# Patient Record
Sex: Male | Born: 1990 | Race: Black or African American | Hispanic: No | Marital: Single | Smoking: Former smoker
Health system: Southern US, Community
[De-identification: ages and names within clinical notes are randomized; demographics above are authoritative.]

## PROBLEM LIST (undated history)

## (undated) DIAGNOSIS — J40 Bronchitis, not specified as acute or chronic: Secondary | ICD-10-CM

## (undated) HISTORY — PX: OTHER SURGICAL HISTORY: SHX169

---

## 2010-06-25 ENCOUNTER — Emergency Department (HOSPITAL_COMMUNITY): Admission: EM | Admit: 2010-06-25 | Discharge: 2010-06-25 | Payer: Self-pay | Admitting: Family Medicine

## 2011-10-28 ENCOUNTER — Emergency Department (HOSPITAL_COMMUNITY)
Admission: EM | Admit: 2011-10-28 | Discharge: 2011-10-29 | Disposition: A | Discharge status: Home / Self Care | Payer: BC Managed Care – PPO | Attending: Emergency Medicine | Admitting: Emergency Medicine

## 2011-10-28 ENCOUNTER — Encounter (HOSPITAL_COMMUNITY): Payer: Self-pay | Admitting: *Deleted

## 2011-10-28 DIAGNOSIS — S8000XA Contusion of unspecified knee, initial encounter: Secondary | ICD-10-CM

## 2011-10-28 DIAGNOSIS — S025XXA Fracture of tooth (traumatic), initial encounter for closed fracture: Secondary | ICD-10-CM

## 2011-10-28 DIAGNOSIS — S60229A Contusion of unspecified hand, initial encounter: Secondary | ICD-10-CM | POA: Insufficient documentation

## 2011-10-28 DIAGNOSIS — F17200 Nicotine dependence, unspecified, uncomplicated: Secondary | ICD-10-CM | POA: Insufficient documentation

## 2011-10-28 DIAGNOSIS — W1789XA Other fall from one level to another, initial encounter: Secondary | ICD-10-CM | POA: Insufficient documentation

## 2011-10-28 NOTE — ED Notes (Signed)
The pt says he had a long fall approx one hour ago.  Rt knee face pain with scattered abrasion,  Rt  And lt central incisors upper broken.

## 2011-10-29 ENCOUNTER — Emergency Department (HOSPITAL_COMMUNITY): Payer: BC Managed Care – PPO

## 2011-10-29 ENCOUNTER — Encounter (HOSPITAL_COMMUNITY): Payer: Self-pay | Admitting: Radiology

## 2011-10-29 MED ORDER — PENICILLIN V POTASSIUM 500 MG PO TABS: 500.0000 mg | ORAL_TABLET | Freq: Four times a day (QID) | ORAL | Status: AC

## 2011-10-29 MED ORDER — SODIUM CHLORIDE 0.9 % IV BOLUS (SEPSIS)
1000.0000 mL | Freq: Once | INTRAVENOUS | Status: AC
  Administered 2011-10-29: 1000 mL via INTRAVENOUS

## 2011-10-29 MED ORDER — HYDROMORPHONE HCL PF 1 MG/ML IJ SOLN
1.0000 mg | Freq: Once | INTRAMUSCULAR | Status: AC
  Administered 2011-10-29: 1 mg via INTRAVENOUS
  Filled 2011-10-29: qty 1

## 2011-10-29 MED ORDER — ONDANSETRON HCL 4 MG/2ML IJ SOLN
4.0000 mg | Freq: Once | INTRAMUSCULAR | Status: AC
  Administered 2011-10-29: 4 mg via INTRAVENOUS
  Filled 2011-10-29: qty 2

## 2011-10-29 MED ORDER — HYDROCODONE-ACETAMINOPHEN 5-500 MG PO TABS: 1.0000 | ORAL_TABLET | Freq: Four times a day (QID) | ORAL | Status: AC | PRN

## 2011-10-29 MED ORDER — NAPROXEN 500 MG PO TABS: 500.0000 mg | ORAL_TABLET | Freq: Two times a day (BID) | ORAL | Status: DC

## 2011-10-29 NOTE — ED Provider Notes (Signed)
History     CSN: [id number]  Arrival date & time 10/28/11  2307   First MD Initiated Contact with Patient 10/29/11 0231      Chief Complaint  Patient presents with  . Fall    (Consider location/radiation/quality/duration/timing/severity/associated sxs/prior treatment) Patient is a 21 y.o. male presenting with fall. The history is provided by the patient. No language interpreter was used.  Fall The accident occurred 3 to 5 hours ago. Incident: while running and jumping from fence. He fell from a height of 3 to 5 ft. He landed on grass. The volume of blood lost was minimal. The point of impact was the head and right knee. The pain is present in the head and right knee (hand). The pain is moderate. He was ambulatory at the scene. Associated symptoms include headaches. Pertinent negatives include no visual change, no fever, no numbness, no abdominal pain, no nausea, no vomiting and no loss of consciousness.    History reviewed. No pertinent past medical history.  History reviewed. No pertinent past surgical history.  History reviewed. No pertinent family history.  History  Substance Use Topics  . Smoking status: Current Everyday Smoker  . Smokeless tobacco: Not on file  . Alcohol Use: No      Review of Systems  Constitutional: Negative for fever, activity change, appetite change and fatigue.  HENT: Negative for congestion, sore throat, rhinorrhea, neck pain and neck stiffness.   Respiratory: Negative for cough and shortness of breath.   Cardiovascular: Negative for chest pain and palpitations.  Gastrointestinal: Negative for nausea, vomiting and abdominal pain.  Genitourinary: Negative for dysuria, urgency, frequency and flank pain.  Musculoskeletal: Positive for arthralgias.  Neurological: Positive for headaches. Negative for dizziness, loss of consciousness, weakness, light-headedness and numbness.  All other systems reviewed and are negative.    Allergies  Review of  patient's allergies indicates no known allergies.  Home Medications   Current Outpatient Rx  Name Route Sig Dispense Refill  . HYDROCODONE-ACETAMINOPHEN 5-500 MG PO TABS Oral Take 1-2 tablets by mouth every 6 (six) hours as needed for pain. 15 tablet 0  . NAPROXEN 500 MG PO TABS Oral Take 1 tablet (500 mg total) by mouth 2 (two) times daily. 30 tablet 0  . PENICILLIN V POTASSIUM 500 MG PO TABS Oral Take 1 tablet (500 mg total) by mouth 4 (four) times daily. 40 tablet 0    BP 108/63  Pulse 95  Temp(Src) 98.5 F (36.9 C) (Oral)  Resp 18  SpO2 98%  Physical Exam  Nursing note and vitals reviewed. Constitutional: He is oriented to person, place, and time. He appears well-developed and well-nourished.       Uncomfortable in appearance  HENT:  Head: Normocephalic and atraumatic.  Mouth/Throat: Oropharynx is clear and moist.         Ellis 3 and ellis 2 fractures to the upper front teeth.  Bleeding controlled  Eyes: Conjunctivae and EOM are normal. Pupils are equal, round, and reactive to light.  Neck: Normal range of motion. Neck supple.  Cardiovascular: Normal rate, regular rhythm, normal heart sounds and intact distal pulses.  Exam reveals no gallop and no friction rub.   No murmur heard. Pulmonary/Chest: Effort normal and breath sounds normal. No respiratory distress.  Abdominal: Soft. Bowel sounds are normal. There is no tenderness.  Musculoskeletal: He exhibits tenderness.       Pain on palpation to the L hand and the R knee with full rom.  No obvious deformity  Neurological: He is alert and oriented to person, place, and time. No cranial nerve deficit.  Skin: Skin is warm and dry.       Scattered abrasions    ED Course  Procedures (including critical care time)  Labs Reviewed - No data to display Dg Knee Complete 4 Views Right  10/29/2011  *RADIOLOGY REPORT*  Clinical Data: Status post fall.  Right knee pain.  RIGHT KNEE - COMPLETE 4+ VIEW  Comparison: None.  Findings:  There is no evidence of fracture or dislocation.  The joint spaces are preserved.  No significant degenerative change is seen; the patellofemoral joint is grossly unremarkable in appearance.  Minimal irregularity at the upper pole of the patella likely remains within normal limits.  No significant joint effusion is seen.  The visualized soft tissues are normal in appearance.  IMPRESSION: No evidence of fracture or dislocation.  Original Report Authenticated By: Tonia Ghent, M.D.   Dg Hand Complete Left  10/29/2011  *RADIOLOGY REPORT*  Clinical Data: Status post fall; left hand pain.  LEFT HAND - COMPLETE 3+ VIEW  Comparison: None.  Findings: There is no evidence of fracture or dislocation.  A tiny osseous fragment adjacent to the base of the fifth metacarpal likely reflects minimal degenerative change.  The joint spaces are preserved; the soft tissues are unremarkable in appearance.  The carpal rows are intact, and demonstrate normal alignment.  IMPRESSION: No definite evidence of fracture or dislocation; tiny osseous fragment adjacent to the base of the fifth metacarpal likely reflects minimal degenerative change.  Original Report Authenticated By: Tonia Ghent, M.D.   Ct Maxillofacial Wo Cm  10/29/2011  *RADIOLOGY REPORT*  Clinical Data: Hit face and mouth on concrete after jumping over fence; broke front teeth.  Concern for facial injury.  CT MAXILLOFACIAL WITHOUT CONTRAST  Technique:  Multidetector CT imaging of the maxillofacial structures was performed. Multiplanar CT image reconstructions were also generated.  Comparison: None.  Findings: Tiny osseous fragments adjacent to the right central maxillary incisor may reflect the known front tooth fractures.  There is no additional evidence of fracture or dislocation.  The maxilla and mandible appear intact.  The nasal bone is unremarkable in appearance.  The orbits are intact bilaterally.  The paranasal sinuses and mastoid air cells are well-aerated.   Mild soft tissue swelling is noted overlying the maxilla bilaterally.  The soft tissues are otherwise unremarkable in appearance.  The parapharyngeal fat planes are preserved.  The nasopharynx, oropharynx and hypopharynx are unremarkable in appearance.  The visualized portions of the valleculae and piriform sinuses are grossly unremarkable.  The parotid and submandibular glands are within normal limits.  No cervical lymphadenopathy is seen.  The visualized portions of the brain are unremarkable.  IMPRESSION:  1.  Tiny osseous fragment adjacent to the right central maxillary incisor may reflect the known front tooth fractures. 2.  Mild soft tissue swelling noted overlying the maxilla bilaterally. 3.  Otherwise unremarkable maxillofacial CT.  Original Report Authenticated By: Tonia Ghent, M.D.     1. Traumatic fracture of tooth   2. Hand contusion   3. Knee contusion       MDM  Patient was given followup for dentistry. Protective coating was placed over his fractured teeth. He is placed in a splint due to the possibility of a avulsion type fracture. Instructed to followup with orthopedics. Should followup with dentistry today. Provided penicillin, pain medication        Dayton Bailiff, MD 10/29/11 [id number]

## 2011-10-29 NOTE — ED Notes (Signed)
Ortho paged for splint 

## 2011-10-29 NOTE — ED Provider Notes (Signed)
  Physical Exam  BP 108/63  Pulse 95  Temp(Src) 98.5 F (36.9 C) (Oral)  Resp 18  SpO2 98%  Physical Exam  ED Course  Dental Date/Time: 10/29/2011 3:10 AM Performed by: Arman Filter Authorized by: Arman Filter Consent: Verbal consent obtained. Consent given by: patient Patient identity confirmed: verbally with patient Local anesthesia used: no Patient sedated: no Comments: Sealing cement applied to upper frontal incisors    MDM Tony Carr 2 fracture of the upper central incisors      Arman Filter, NP 10/29/11 [phone]

## 2011-10-29 NOTE — Discharge Instructions (Signed)
Contusion A contusion is a deep bruise. Contusions are the result of an injury that caused bleeding under the skin. The contusion may turn blue, purple, or yellow. Minor injuries will give you a painless contusion, but more severe contusions may stay painful and swollen for a few weeks.  CAUSES  A contusion is usually caused by a blow, trauma, or direct force to an area of the body. SYMPTOMS   Swelling and redness of the injured area.   Bruising of the injured area.   Tenderness and soreness of the injured area.   Pain.  DIAGNOSIS  The diagnosis can be made by taking a history and physical exam. An X-ray, CT scan, or MRI may be needed to determine if there were any associated injuries, such as fractures. TREATMENT  Specific treatment will depend on what area of the body was injured. In general, the best treatment for a contusion is resting, icing, elevating, and applying cold compresses to the injured area. Over-the-counter medicines may also be recommended for pain control. Ask your caregiver what the best treatment is for your contusion. HOME CARE INSTRUCTIONS   Put ice on the injured area.   Put ice in a plastic bag.   Place a towel between your skin and the bag.   Leave the ice on for 15 to 20 minutes, 3 to 4 times a day.   Only take over-the-counter or prescription medicines for pain, discomfort, or fever as directed by your caregiver. Your caregiver may recommend avoiding anti-inflammatory medicines (aspirin, ibuprofen, and naproxen) for 48 hours because these medicines may increase bruising.   Rest the injured area.   If possible, elevate the injured area to reduce swelling.  SEEK IMMEDIATE MEDICAL CARE IF:   You have increased bruising or swelling.   You have pain that is getting worse.   Your swelling or pain is not relieved with medicines.  MAKE SURE YOU:   Understand these instructions.   Will watch your condition.   Will get help right away if you are not  doing well or get worse.  Document Released: 05/07/2005 Document Revised: 07/17/2011 Document Reviewed: 06/02/2011 [facility] Patient Information 2012 [place], [place].  Dental Fracture You have a dental fracture or injury. This can mean the tooth is loose, has a chip in the enamel or is broken. If just the outer enamel is chipped, there is a good chance the tooth will not become infected. The only treatment needed may be to smooth off a rough edge. Fractures into the deeper layers (dentin and pulp) cause greater pain and are more likely to become infected. These require you to see a dentist as soon as possible to save the tooth. Loose teeth may need to be wired or bonded with a plastic splint to hold them in place. A paste may be painted on the open area of the broken tooth to reduce the pain. Antibiotics and pain medicine may be prescribed. Choosing a soft or liquid diet and rinsing the mouth out with warm water after meals may be helpful. See your dentist as recommended. Failure to seek care or follow up with a dentist or other specialist as recommended could result in the loss of your tooth, infection, or permanent dental problems. SEEK MEDICAL CARE IF:   You have increased pain not controlled with medicines.   You have swelling around the tooth, in the face or neck.   You have bleeding which starts, continues, or gets worse.   You have a fever.  Document Released: 09/04/2004 Document Revised: 07/17/2011 Document Reviewed: 06/19/2009 [facility] Patient Information 2012 [place], [place].  RESOURCE GUIDE  Dental Problems  Patients with Medicaid: [facility] [phone] [address]                                           [phone] [address] Phone:  [phone]                                                  Phone:  [phone]  If unable to pay or uninsured, contact:  [facility] or [facility]. to become qualified for the adult  dental clinic.  Chronic Pain Problems Contact [facility]  [phone] Patients need to be referred by their primary care doctor.  Insufficient Money for Medicine Contact [employer]:  call "211" or [facility] 289 469 8173.  No Primary Care Doctor Call [facility]  [phone] Other agencies that provide inexpensive medical care    [facility]  [phone]    [facility]  [phone]    [facility]  7800246703    [facility]  [phone]    [facility]  [phone]    [facility]  [phone]  Psychological Services [facility]  [phone] [facility]  [phone] [facility]   [phone] (emergency services [phone])  Substance Abuse Resources [facility]  6053002413 [facility] 3033684919 [facility] [phone] Floydene Flock [phone] Residential & Outpatient Substance Abuse Program  9362049290  Abuse/Neglect [facility] Child Abuse Hotline 561-064-8186 [facility] Child Abuse Hotline [phone] (After Hours)  Emergency Shelter [facility] (251)673-0078  M[facility] [place] [facility] 325-410-1726 [facility] [phone]  MRSA Hotline #:   938 800 6479    [facility] Resources  [facility]     [employer]                          [facility] Dept. [address] [place]                       [address]      [address]  [place]                                                Cristobal Goldmann Phone:  [phone]                                   Phone:  [phone]  Phone:  [phone]  [facility] Phone:  [phone]  [facility] Child Abuse Hotline ([phone])[phone] (After Hours)

## 2012-04-07 ENCOUNTER — Encounter (HOSPITAL_COMMUNITY): Payer: Self-pay | Admitting: Family Medicine

## 2012-04-07 ENCOUNTER — Emergency Department (HOSPITAL_COMMUNITY)
Admission: EM | Admit: 2012-04-07 | Discharge: 2012-04-07 | Disposition: A | Discharge status: Home / Self Care | Payer: BC Managed Care – PPO | Attending: Emergency Medicine | Admitting: Emergency Medicine

## 2012-04-07 ENCOUNTER — Emergency Department (HOSPITAL_COMMUNITY): Payer: BC Managed Care – PPO

## 2012-04-07 DIAGNOSIS — F17200 Nicotine dependence, unspecified, uncomplicated: Secondary | ICD-10-CM | POA: Insufficient documentation

## 2012-04-07 DIAGNOSIS — J069 Acute upper respiratory infection, unspecified: Secondary | ICD-10-CM

## 2012-04-07 DIAGNOSIS — B9789 Other viral agents as the cause of diseases classified elsewhere: Secondary | ICD-10-CM | POA: Insufficient documentation

## 2012-04-07 DIAGNOSIS — B349 Viral infection, unspecified: Secondary | ICD-10-CM

## 2012-04-07 MED ORDER — IBUPROFEN 800 MG PO TABS: 800.0000 mg | ORAL_TABLET | Freq: Three times a day (TID) | ORAL | Status: AC | PRN

## 2012-04-07 MED ORDER — HYDROCOD POLST-CHLORPHEN POLST 10-8 MG/5ML PO LQCR
5.0000 mL | Freq: Once | ORAL | Status: AC
  Administered 2012-04-07: 5 mL via ORAL
  Filled 2012-04-07: qty 5

## 2012-04-07 MED ORDER — HYDROCOD POLST-CHLORPHEN POLST 10-8 MG/5ML PO LQCR: 5.0000 mL | Freq: Two times a day (BID) | ORAL | Status: DC

## 2012-04-07 MED ORDER — IBUPROFEN 400 MG PO TABS
800.0000 mg | ORAL_TABLET | Freq: Once | ORAL | Status: AC
  Administered 2012-04-07: 800 mg via ORAL
  Filled 2012-04-07: qty 2

## 2012-04-07 NOTE — ED Provider Notes (Signed)
History     CSN: [id number]  Arrival date & time 04/07/12  1750   First MD Initiated Contact with Patient 04/07/12 1906      Chief Complaint  Patient presents with  . Cough  . Fever  . Chills   HPI  History provided by the patient. Patient is a 21 year old male with no significant PMH who presents with complaints of cough, fever, chills and body aches. Symptoms began to 3 days ago. Patient has been trying to rest and drink plenty of fluids but has not tried any medications for symptoms. He reports productive cough of yellow phlegm and sputum. Coughing also causes some pain in the chest. Symptoms are also associated with sore throat, nasal congestion and rhinorrhea. Patient has had generalized fatigue. Patient has not had any recent travel. He does not know of any known sick contacts. He denies any hemoptysis. Patient does report occasional episodes of nausea and vomiting after severe coughing. Patient denies any abdominal pain, diarrhea or constipation.    History reviewed. No pertinent past medical history.  History reviewed. No pertinent past surgical history.  History reviewed. No pertinent family history.  History  Substance Use Topics  . Smoking status: Current Everyday Smoker  . Smokeless tobacco: Not on file  . Alcohol Use: No      Review of Systems  Constitutional: Positive for fever, chills, diaphoresis, appetite change and fatigue. Negative for unexpected weight change.  HENT: Positive for congestion, sore throat and rhinorrhea. Negative for ear pain.   Respiratory: Positive for cough. Negative for shortness of breath.   Cardiovascular: Positive for chest pain.  Gastrointestinal: Positive for nausea and vomiting. Negative for abdominal pain, diarrhea and constipation.    Allergies  Review of patient's allergies indicates no known allergies.  Home Medications  No current outpatient prescriptions on file.  BP 118/79  Pulse 98  Temp 99.6 F (37.6 C)  Resp  18  SpO2 99%  Physical Exam  Nursing note and vitals reviewed. Constitutional: He is oriented to person, place, and time. He appears well-developed and well-nourished. No distress.  HENT:  Head: Normocephalic.  Right Ear: Tympanic membrane normal.  Left Ear: Tympanic membrane normal.  Mouth/Throat: Oropharynx is clear and moist.       Mild erythema of pharynx  Neck: Normal range of motion. Neck supple.       No meningeal sign  Cardiovascular: Normal rate and regular rhythm.   Pulmonary/Chest: Effort normal and breath sounds normal. No respiratory distress. He has no wheezes. He has no rales.  Abdominal: Soft. There is no tenderness. There is no rebound and no guarding.  Lymphadenopathy:    He has no cervical adenopathy.  Neurological: He is alert and oriented to person, place, and time.  Skin: Skin is warm.  Psychiatric: He has a normal mood and affect. His behavior is normal.    ED Course  Procedures   Dg Chest 2 View  04/07/2012  *RADIOLOGY REPORT*  Clinical Data: Cough, fever, chills.  CHEST - 2 VIEW  Comparison: None.  Findings: Lungs clear.  Heart size and pulmonary vascularity normal.  No effusion.  Visualized bones unremarkable.  IMPRESSION: No acute disease   Original Report Authenticated By: Thora Lance III, M.D.      1. URI (upper respiratory infection)   2. Viral infection       MDM  8:10 PM patient seen and evaluated. Patient is well-appearing and not toxic. X-ray unremarkable. Lungs are clear on auscultation. At this  time suspect viral process the        Angus Seller, [place] 04/07/12 2053

## 2012-04-07 NOTE — ED Provider Notes (Signed)
Medical screening examination/treatment/procedure(s) were performed by non-physician practitioner and as supervising physician I was immediately available for consultation/collaboration.   Tawona Filsinger, MD 04/07/12 2146 

## 2012-04-07 NOTE — ED Notes (Signed)
Per pt sts N,V, cold sweats. sts feels like he is coming down with the flu

## 2014-03-14 ENCOUNTER — Encounter (HOSPITAL_COMMUNITY): Payer: Self-pay | Admitting: Emergency Medicine

## 2014-03-14 ENCOUNTER — Emergency Department (HOSPITAL_COMMUNITY): Payer: BC Managed Care – PPO

## 2014-03-14 ENCOUNTER — Emergency Department (HOSPITAL_COMMUNITY)
Admission: EM | Admit: 2014-03-14 | Discharge: 2014-03-14 | Disposition: A | Discharge status: Home / Self Care | Payer: BC Managed Care – PPO | Attending: Emergency Medicine | Admitting: Emergency Medicine

## 2014-03-14 DIAGNOSIS — R51 Headache: Secondary | ICD-10-CM

## 2014-03-14 DIAGNOSIS — F17200 Nicotine dependence, unspecified, uncomplicated: Secondary | ICD-10-CM | POA: Insufficient documentation

## 2014-03-14 DIAGNOSIS — S0990XA Unspecified injury of head, initial encounter: Secondary | ICD-10-CM | POA: Insufficient documentation

## 2014-03-14 DIAGNOSIS — R519 Headache, unspecified: Secondary | ICD-10-CM

## 2014-03-14 DIAGNOSIS — R296 Repeated falls: Secondary | ICD-10-CM | POA: Insufficient documentation

## 2014-03-14 DIAGNOSIS — Y9389 Activity, other specified: Secondary | ICD-10-CM | POA: Insufficient documentation

## 2014-03-14 DIAGNOSIS — Y92009 Unspecified place in unspecified non-institutional (private) residence as the place of occurrence of the external cause: Secondary | ICD-10-CM | POA: Insufficient documentation

## 2014-03-14 DIAGNOSIS — R079 Chest pain, unspecified: Secondary | ICD-10-CM | POA: Insufficient documentation

## 2014-03-14 DIAGNOSIS — W19XXXA Unspecified fall, initial encounter: Secondary | ICD-10-CM

## 2014-03-14 LAB — CBC
HEMATOCRIT: 42.1 % (ref 39.0–52.0)
HEMOGLOBIN: 14.5 g/dL (ref 13.0–17.0)
MCH: 30.1 pg (ref 26.0–34.0)
MCHC: 34.4 g/dL (ref 30.0–36.0)
MCV: 87.5 fL (ref 78.0–100.0)
Platelets: 274 10*3/uL (ref 150–400)
RBC: 4.81 MIL/uL (ref 4.22–5.81)
RDW: 13.5 % (ref 11.5–15.5)
WBC: 4.4 10*3/uL (ref 4.0–10.5)

## 2014-03-14 LAB — BASIC METABOLIC PANEL
ANION GAP: 12 (ref 5–15)
BUN: 18 mg/dL (ref 6–23)
CALCIUM: 8.7 mg/dL (ref 8.4–10.5)
CO2: 24 meq/L (ref 19–32)
CREATININE: 1.25 mg/dL (ref 0.50–1.35)
Chloride: 105 mEq/L (ref 96–112)
GFR calc Af Amer: >90 mL/min (ref >90)
GFR calc non Af Amer: 79 mL/min — ABNORMAL LOW (ref >90)
Glucose, Bld: 126 mg/dL — ABNORMAL HIGH (ref 70–99)
Potassium: 3.5 mEq/L — ABNORMAL LOW (ref 3.7–5.3)
Sodium: 141 mEq/L (ref 137–147)

## 2014-03-14 LAB — I-STAT TROPONIN, ED: Troponin i, poc: 0 ng/mL (ref 0.00–0.08)

## 2014-03-14 LAB — CBG MONITORING, ED: Glucose-Capillary: 157 mg/dL — ABNORMAL HIGH (ref 70–99)

## 2014-03-14 MED ORDER — SODIUM CHLORIDE 0.9 % IV SOLN
1000.0000 mL | INTRAVENOUS | Status: DC
  Administered 2014-03-14: 1000 mL via INTRAVENOUS

## 2014-03-14 MED ORDER — DIPHENHYDRAMINE HCL 50 MG/ML IJ SOLN
25.0000 mg | Freq: Once | INTRAMUSCULAR | Status: AC
  Administered 2014-03-14: 25 mg via INTRAVENOUS
  Filled 2014-03-14: qty 1

## 2014-03-14 MED ORDER — METOCLOPRAMIDE HCL 5 MG/ML IJ SOLN
10.0000 mg | Freq: Once | INTRAMUSCULAR | Status: AC
  Administered 2014-03-14: 10 mg via INTRAVENOUS
  Filled 2014-03-14: qty 2

## 2014-03-14 MED ORDER — ONDANSETRON HCL 4 MG/2ML IJ SOLN
4.0000 mg | Freq: Once | INTRAMUSCULAR | Status: DC
  Filled 2014-03-14 (×2): qty 2

## 2014-03-14 MED ORDER — METOCLOPRAMIDE HCL 10 MG PO TABS: 10.0000 mg | ORAL_TABLET | Freq: Four times a day (QID) | ORAL | Status: DC | PRN

## 2014-03-14 MED ORDER — SODIUM CHLORIDE 0.9 % IV SOLN
1000.0000 mL | Freq: Once | INTRAVENOUS | Status: AC
  Administered 2014-03-14: 1000 mL via INTRAVENOUS

## 2014-03-14 MED ORDER — KETOROLAC TROMETHAMINE 30 MG/ML IJ SOLN
30.0000 mg | INTRAMUSCULAR | Status: AC
  Administered 2014-03-14: 30 mg via INTRAVENOUS
  Filled 2014-03-14: qty 1

## 2014-03-14 NOTE — ED Notes (Signed)
MD at bedside. 

## 2014-03-14 NOTE — ED Notes (Addendum)
Pt refusing to go to x-ray and refusing any care at this time. Pt calm and cooperative. MD Preston FleetingGlick notified.

## 2014-03-14 NOTE — Discharge Instructions (Signed)
Return if your symptoms are getting worse.  General Headache Without Cause A headache is pain or discomfort felt around the head or neck area. The specific cause of a headache may not be found. There are many causes and types of headaches. A few common ones are:  Tension headaches.  Migraine headaches.  Cluster headaches.  Chronic daily headaches. HOME CARE INSTRUCTIONS   Keep all follow-up appointments with your caregiver or any specialist referral.  Only take over-the-counter or prescription medicines for pain or discomfort as directed by your caregiver.  Lie down in a dark, quiet room when you have a headache.  Keep a headache journal to find out what may trigger your migraine headaches. For example, write down:  What you eat and drink.  How much sleep you get.  Any change to your diet or medicines.  Try massage or other relaxation techniques.  Put ice packs or heat on the head and neck. Use these 3 to 4 times per day for 15 to 20 minutes each time, or as needed.  Limit stress.  Sit up straight, and do not tense your muscles.  Quit smoking if you smoke.  Limit alcohol use.  Decrease the amount of caffeine you drink, or stop drinking caffeine.  Eat and sleep on a regular schedule.  Get 7 to 9 hours of sleep, or as recommended by your caregiver.  Keep lights dim if bright lights bother you and make your headaches worse. SEEK MEDICAL CARE IF:   You have problems with the medicines you were prescribed.  Your medicines are not working.  You have a change from the usual headache.  You have nausea or vomiting. SEEK IMMEDIATE MEDICAL CARE IF:   Your headache becomes severe.  You have a fever.  You have a stiff neck.  You have loss of vision.  You have muscular weakness or loss of muscle control.  You start losing your balance or have trouble walking.  You feel faint or pass out.  You have severe symptoms that are different from your first  symptoms. MAKE SURE YOU:   Understand these instructions.  Will watch your condition.  Will get help right away if you are not doing well or get worse. Document Released: 07/28/2005 Document Revised: 10/20/2011 Document Reviewed: 08/13/2011 [facility] Patient Information 2015 [place], [place]. This information is not intended to replace advice given to you by your health care provider. Make sure you discuss any questions you have with your health care provider.  Metoclopramide tablets What is this medicine? METOCLOPRAMIDE (met oh kloe PRA mide) is used to treat the symptoms of gastroesophageal reflux disease (GERD) like heartburn. It is also used to treat people with slow emptying of the stomach and intestinal tract. This medicine may be used for other purposes; ask your health care provider or pharmacist if you have questions. COMMON BRAND NAME(S): Reglan What should I tell my health care provider before I take this medicine? They need to know if you have any of these conditions: -breast cancer -depression -diabetes -heart failure -high blood pressure -kidney disease -liver disease -Parkinson's disease or a movement disorder -pheochromocytoma -seizures -stomach obstruction, bleeding, or perforation -an unusual or allergic reaction to metoclopramide, procainamide, sulfites, other medicines, foods, dyes, or preservatives -pregnant or trying to get pregnant -breast-feeding How should I use this medicine? Take this medicine by mouth with a glass of water. Follow the directions on the prescription label. Take this medicine on an empty stomach, about 30 minutes before eating. Take  your doses at regular intervals. Do not take your medicine more often than directed. Do not stop taking except on the advice of your doctor or health care professional. A special MedGuide will be given to you by the pharmacist with each prescription and refill. Be sure to read this information carefully each  time. Talk to your pediatrician regarding the use of this medicine in children. Special care may be needed. Overdosage: If you think you have taken too much of this medicine contact a poison control center or emergency room at once. NOTE: This medicine is only for you. Do not share this medicine with others. What if I miss a dose? If you miss a dose, take it as soon as you can. If it is almost time for your next dose, take only that dose. Do not take double or extra doses. What may interact with this medicine? -acetaminophen -cyclosporine -digoxin -medicines for blood pressure -medicines for diabetes, including insulin -medicines for hay fever and other allergies -medicines for depression, especially an Monoamine Oxidase Inhibitor (MAOI) -medicines for Parkinson's disease, like levodopa -medicines for sleep or for pain -tetracycline This list may not describe all possible interactions. Give your health care provider a list of all the medicines, herbs, non-prescription drugs, or dietary supplements you use. Also tell them if you smoke, drink alcohol, or use illegal drugs. Some items may interact with your medicine. What should I watch for while using this medicine? It may take a few weeks for your stomach condition to start to get better. However, do not take this medicine for longer than 12 weeks. The longer you take this medicine, and the more you take it, the greater your chances are of developing serious side effects. If you are an elderly patient, a male patient, or you have diabetes, you may be at an increased risk for side effects from this medicine. Contact your doctor immediately if you start having movements you cannot control such as lip smacking, rapid movements of the tongue, involuntary or uncontrollable movements of the eyes, head, arms and legs, or muscle twitches and spasms. Patients and their families should watch out for worsening depression or thoughts of suicide. Also watch  out for any sudden or severe changes in feelings such as feeling anxious, agitated, panicky, irritable, hostile, aggressive, impulsive, severely restless, overly excited and hyperactive, or not being able to sleep. If this happens, especially at the beginning of treatment or after a change in dose, call your doctor. Do not treat yourself for high fever. Ask your doctor or health care professional for advice. You may get drowsy or dizzy. Do not drive, use machinery, or do anything that needs mental alertness until you know how this drug affects you. Do not stand or sit up quickly, especially if you are an older patient. This reduces the risk of dizzy or fainting spells. Alcohol can make you more drowsy and dizzy. Avoid alcoholic drinks. What side effects may I notice from receiving this medicine? Side effects that you should report to your doctor or health care professional as soon as possible: -allergic reactions like skin rash, itching or hives, swelling of the face, lips, or tongue -abnormal production of milk in females -breast enlargement in both males and females -change in the way you walk -difficulty moving, speaking or swallowing -drooling, lip smacking, or rapid movements of the tongue -excessive sweating -fever -involuntary or uncontrollable movements of the eyes, head, arms and legs -irregular heartbeat or palpitations -muscle twitches and spasms -unusually weak  or tired Side effects that usually do not require medical attention (report to your doctor or health care professional if they continue or are bothersome): -change in sex drive or performance -depressed mood -diarrhea -difficulty sleeping -headache -menstrual changes -restless or nervous This list may not describe all possible side effects. Call your doctor for medical advice about side effects. You may report side effects to FDA at 1-800-FDA-1088. Where should I keep my medicine? Keep out of the reach of  children. Store at room temperature between 20 and 25 degrees C (68 and 77 degrees F). Protect from light. Keep container tightly closed. Throw away any unused medicine after the expiration date. NOTE: This sheet is a summary. It may not cover all possible information. If you have questions about this medicine, talk to your doctor, pharmacist, or health care provider.  2015, [employer]. (2011-11-25 13:04:38)

## 2014-03-14 NOTE — ED Notes (Signed)
Stupor, patient not answering questions, states that "my head hurts". Did report falling. Unable to get any more information from patient.

## 2014-03-14 NOTE — ED Notes (Signed)
This nurse transported patient to CT

## 2014-03-14 NOTE — ED Notes (Signed)
Dr. Preston FleetingGlick at bedside to assess patient

## 2014-03-14 NOTE — ED Provider Notes (Signed)
CSN: [id number]     Arrival date & time 03/14/14  0118 History   First MD Initiated Contact with Patient 03/14/14 0235     Chief Complaint  Patient presents with  . Chest Pain     (Consider location/radiation/quality/duration/timing/severity/associated sxs/prior Treatment) Patient is a 23 y.o. male presenting with chest pain. The history is provided by the patient (and triage note). The history is limited by the condition of the patient (Altered mental status).  Chest Pain He is somnolent and is free soda answer questions and does answer all questions. She is complaining of his head hurting and does state that he fell. He is not able to give me any more information. If he is not complaining of chest pain or nausea. Triage note states that friends report he had been smoking marijuana and had been passing out and had complained of chest pain. He denies that when I am talking to him.  History reviewed. No pertinent past medical history. History reviewed. No pertinent past surgical history. No family history on file. History  Substance Use Topics  . Smoking status: Current Every Day Smoker  . Smokeless tobacco: Not on file  . Alcohol Use: No    Review of Systems  Unable to perform ROS: Mental status change  Cardiovascular: Positive for chest pain.      Allergies  Review of patient's allergies indicates no known allergies.  Home Medications   Prior to Admission medications   Medication Sig Start Date End Date Taking? Authorizing Provider  chlorpheniramine-HYDROcodone (TUSSIONEX PENNKINETIC ER) 10-8 MG/5ML LQCR Take 5 mLs by mouth every 12 (twelve) hours. 04/07/12   Phill Mutter Dammen, PA-C   BP 111/65  Pulse 75  Temp(Src) 98.4 F (36.9 C) (Oral)  Resp 17  SpO2 99% Physical Exam  Nursing note and vitals reviewed.  23 year old male, resting comfortably and in no acute distress. Vital signs are normal. Oxygen saturation is 99%, which is normal. He is holding his head. Head is  normocephalic and atraumatic. PERRLA, EOMI. Oropharynx is clear. Fundi show no hemorrhage, exudate, or papilledema. Neck is nontender without adenopathy or JVD. There is questionable nuchal rigidity. Back is nontender and there is no CVA tenderness. Lungs are clear without rales, wheezes, or rhonchi. Chest is nontender. Heart has regular rate and rhythm with 1/6 systolic ejection murmur. Abdomen is soft, flat, nontender without masses or hepatosplenomegaly and peristalsis is normoactive. Extremities have no cyanosis or edema, full range of motion is present. Skin is warm and dry without rash. Neurologic: He is awake but slow to answer questions. He is oriented to person and place, cranial nerves are intact, there are no motor or sensory deficits.  ED Course  Procedures (including critical care time) Labs Review Results for orders placed during the hospital encounter of 03/14/14  CBC      Result Value Ref Range   WBC 4.4  4.0 - 10.5 K/uL   RBC 4.81  4.22 - 5.81 MIL/uL   Hemoglobin 14.5  13.0 - 17.0 g/dL   HCT 30.8  65.7 - 84.6 %   MCV 87.5  78.0 - 100.0 fL   MCH 30.1  26.0 - 34.0 pg   MCHC 34.4  30.0 - 36.0 g/dL   RDW 96.2  95.2 - 84.1 %   Platelets 274  150 - 400 K/uL  BASIC METABOLIC PANEL      Result Value Ref Range   Sodium 141  137 - 147 mEq/L   Potassium 3.5 (*)  3.7 - 5.3 mEq/L   Chloride 105  96 - 112 mEq/L   CO2 24  19 - 32 mEq/L   Glucose, Bld 126 (*) 70 - 99 mg/dL   BUN 18  6 - 23 mg/dL   Creatinine, Ser 1.61  0.50 - 1.35 mg/dL   Calcium 8.7  8.4 - 09.6 mg/dL   GFR calc non Af Amer 79 (*) >90 mL/min   GFR calc Af Amer >90  >90 mL/min   Anion gap 12  5 - 15  I-STAT TROPOININ, ED      Result Value Ref Range   Troponin i, poc 0.00  0.00 - 0.08 ng/mL   Comment 3           CBG MONITORING, ED      Result Value Ref Range   Glucose-Capillary 157 (*) 70 - 99 mg/dL   Imaging Review Ct Head Wo Contrast  03/14/2014   EXAM: CT HEAD WITHOUT CONTRAST  CT CERVICAL SPINE  WITHOUT CONTRAST  TECHNIQUE: Multidetector CT imaging of the head and cervical spine was performed following the standard protocol without intravenous contrast. Multiplanar CT image reconstructions of the cervical spine were also generated.  COMPARISON:  None.  FINDINGS: CT HEAD FINDINGS  There is no acute intracranial hemorrhage or infarct. No mass lesion or midline shift. Gray-white matter differentiation is well maintained. Ventricles are normal in size without evidence of hydrocephalus. CSF containing spaces are within normal limits. No extra-axial fluid collection.  The calvarium is intact.  Orbital soft tissues are within normal limits.  The paranasal sinuses and mastoid air cells are well pneumatized and free of fluid.  Scalp soft tissues are unremarkable.  CT CERVICAL SPINE FINDINGS  The vertebral bodies are normally aligned with preservation of the normal cervical lordosis. Vertebral body heights are preserved. Normal C1-2 articulations are intact. No prevertebral soft tissue swelling. No acute fracture or listhesis.  Degenerative osteophyte seen at the anterior aspect of C5. No other significant degenerative changes.  Visualized soft tissues of the neck are within normal limits.  IMPRESSION: CT BRAIN:  No acute intracranial abnormality.  CT CERVICAL SPINE:  No acute traumatic injury within the cervical spine.   Electronically Signed   By: Rise Mu M.D.   On: 03/14/2014 03:28   Ct Cervical Spine Wo Contrast  03/14/2014   EXAM: CT HEAD WITHOUT CONTRAST  CT CERVICAL SPINE WITHOUT CONTRAST  TECHNIQUE: Multidetector CT imaging of the head and cervical spine was performed following the standard protocol without intravenous contrast. Multiplanar CT image reconstructions of the cervical spine were also generated.  COMPARISON:  None.  FINDINGS: CT HEAD FINDINGS  There is no acute intracranial hemorrhage or infarct. No mass lesion or midline shift. Gray-white matter differentiation is well maintained.  Ventricles are normal in size without evidence of hydrocephalus. CSF containing spaces are within normal limits. No extra-axial fluid collection.  The calvarium is intact.  Orbital soft tissues are within normal limits.  The paranasal sinuses and mastoid air cells are well pneumatized and free of fluid.  Scalp soft tissues are unremarkable.  CT CERVICAL SPINE FINDINGS  The vertebral bodies are normally aligned with preservation of the normal cervical lordosis. Vertebral body heights are preserved. Normal C1-2 articulations are intact. No prevertebral soft tissue swelling. No acute fracture or listhesis.  Degenerative osteophyte seen at the anterior aspect of C5. No other significant degenerative changes.  Visualized soft tissues of the neck are within normal limits.  IMPRESSION: CT BRAIN:  No  acute intracranial abnormality.  CT CERVICAL SPINE:  No acute traumatic injury within the cervical spine.   Electronically Signed   By: Rise MuBenjamin  McClintock M.D.   On: 03/14/2014 03:28     EKG Interpretation   Date/Time:  Tuesday March 14 2014 01:24:14 EDT Ventricular Rate:  84 PR Interval:  154 QRS Duration: 90 QT Interval:  362 QTC Calculation: 427 R Axis:   85 Text Interpretation:  Normal sinus rhythm Normal ECG No old tracing to  compare Confirmed by [facility]GL[facility]  MD, Usiel Astarita ([id number]) on 03/14/2014 2:36:03 AM      MDM   Final diagnoses:  Fall at home, initial encounter  Headache, unspecified headache type    Headache with some neck stiffness worrisome for possible subarachnoid hemorrhage. He'll be sent for CT scan.  CT is unremarkable. He was given IV fluids and metoclopramide and has now awakened. He now tells me that he had smoked a lot of marijuana tonight and it has been sometime since he smoked marijuana. He had fallen. When he came in, he did have a bilateral retro-orbital headache which is improved dramatically with the medication. He is reexamined and the neck is no longer stiff at all. He states  that he wishes to go because he feels he needs to go to work. He is neurologically intact. He is discharged with prescription for metoclopramide.  Dione Boozeavid Eduard Penkala, MD 03/14/14 [phone]

## 2014-03-14 NOTE — ED Notes (Signed)
Pt. dropped off by friends reported that pt. has been smoking marijuana with intermittent syncopal episodes , pt. stated chest pain this evening , poor historian / somnolent at triage .

## 2014-03-14 NOTE — ED Notes (Addendum)
Urine specimen not collected before d/c. MD aware of pending labs before this RN d/c the pt.

## 2014-07-21 ENCOUNTER — Emergency Department (HOSPITAL_COMMUNITY): Payer: BC Managed Care – PPO

## 2014-07-21 ENCOUNTER — Emergency Department (HOSPITAL_COMMUNITY)
Admission: EM | Admit: 2014-07-21 | Discharge: 2014-07-21 | Disposition: A | Discharge status: Home / Self Care | Payer: BC Managed Care – PPO | Attending: Emergency Medicine | Admitting: Emergency Medicine

## 2014-07-21 ENCOUNTER — Encounter (HOSPITAL_COMMUNITY): Payer: Self-pay | Admitting: Emergency Medicine

## 2014-07-21 DIAGNOSIS — Y990 Civilian activity done for income or pay: Secondary | ICD-10-CM | POA: Insufficient documentation

## 2014-07-21 DIAGNOSIS — Y9389 Activity, other specified: Secondary | ICD-10-CM | POA: Insufficient documentation

## 2014-07-21 DIAGNOSIS — S62607A Fracture of unspecified phalanx of left little finger, initial encounter for closed fracture: Secondary | ICD-10-CM | POA: Insufficient documentation

## 2014-07-21 DIAGNOSIS — Z720 Tobacco use: Secondary | ICD-10-CM | POA: Insufficient documentation

## 2014-07-21 DIAGNOSIS — Y9262 Dock or shipyard as the place of occurrence of the external cause: Secondary | ICD-10-CM | POA: Insufficient documentation

## 2014-07-21 DIAGNOSIS — S60417A Abrasion of left little finger, initial encounter: Secondary | ICD-10-CM | POA: Insufficient documentation

## 2014-07-21 DIAGNOSIS — Z23 Encounter for immunization: Secondary | ICD-10-CM | POA: Insufficient documentation

## 2014-07-21 DIAGNOSIS — W231XXA Caught, crushed, jammed, or pinched between stationary objects, initial encounter: Secondary | ICD-10-CM | POA: Insufficient documentation

## 2014-07-21 MED ORDER — TETANUS-DIPHTH-ACELL PERTUSSIS 5-2.5-18.5 LF-MCG/0.5 IM SUSP
0.5000 mL | Freq: Once | INTRAMUSCULAR | Status: AC
  Administered 2014-07-21: 0.5 mL via INTRAMUSCULAR
  Filled 2014-07-21: qty 0.5

## 2014-07-21 MED ORDER — OXYCODONE-ACETAMINOPHEN 5-325 MG PO TABS: 1.0000 | ORAL_TABLET | Freq: Three times a day (TID) | ORAL | Status: DC | PRN

## 2014-07-21 MED ORDER — ACETAMINOPHEN 500 MG PO TABS
1000.0000 mg | ORAL_TABLET | Freq: Once | ORAL | Status: AC
  Administered 2014-07-21: 1000 mg via ORAL
  Filled 2014-07-21: qty 2

## 2014-07-21 NOTE — ED Notes (Signed)
Cleaned pt Lt little finger with Sterile water Per PA request.

## 2014-07-21 NOTE — ED Provider Notes (Signed)
CSN: [id number]     Arrival date & time 07/21/14  0905 History   First MD Initiated Contact with Patient 07/21/14 0914     Chief Complaint  Patient presents with  . Finger Injury     (Consider location/radiation/quality/duration/timing/severity/associated sxs/prior Treatment) HPI  23 year old male presents with a left pinky finger injury suffered last night around 5:30 PM. 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 This is a crush injury. He didn't think it was too bad until he woke up this morning and continued to have pain and had a small cut on his finger that was oozing. Denies weakness or numbness. Patient rates his pain as moderate and wants to just get it checked out. He is unsure of his last tetanus shot but thinks he had all of his high school immunizations.  History reviewed. No pertinent past medical history. History reviewed. No pertinent past surgical history. No family history on file. History  Substance Use Topics  . Smoking status: Current Every Day Smoker  . Smokeless tobacco: Not on file  . Alcohol Use: No    Review of Systems  Musculoskeletal: Positive for joint swelling and arthralgias.  Skin: Positive for wound. Negative for color change.  Neurological: Negative for weakness and numbness.  All other systems reviewed and are negative.     Allergies  Review of patient's allergies indicates no known allergies.  Home Medications   Prior to Admission medications   Not on File   BP 140/84 mmHg  Pulse 67  Temp(Src) 98 F (36.7 C) (Oral)  Resp 20  SpO2 100% Physical Exam  Constitutional: He is oriented to person, place, and time. He appears well-developed and well-nourished.  HENT:  Head: Normocephalic and atraumatic.  Cardiovascular: Normal rate and intact distal pulses.   Pulmonary/Chest: Effort normal.  Musculoskeletal:       Left hand: He exhibits tenderness, bony tenderness and  swelling. He exhibits normal capillary refill and no deformity.       Hands: Neurological: He is alert and oriented to person, place, and time.  Skin: Skin is warm and dry.  Nursing note and vitals reviewed.   ED Course  Procedures (including critical care time) Labs Review Labs Reviewed - No data to display  Imaging Review Dg Finger Little Left  07/21/2014   CLINICAL DATA:  Acute left fifth finger injury after being caught in folding portion of metal ramp.  EXAM: LEFT LITTLE FINGER 2+V  COMPARISON:  None.  FINDINGS: Minimally displaced and comminuted fracture of the fifth distal phalanx is noted. Surrounding soft tissue swelling is noted. This appears to be closed and posttraumatic.  IMPRESSION: Minimally displaced and comminuted fracture of the left fifth distal phalanx. This is the initial encounter.   Electronically Signed   By: Roque LiasJames  Green M.D.   On: 07/21/2014 09:52     EKG Interpretation None      MDM   Final diagnoses:  Fracture of fifth finger, left, closed, initial encounter    Patient with a distal pinky fracture as above. He has a small wound proximal to this does not appear to be a deep laceration or concern for an open joint. Neurovascularly intact. Pain is controlled, will discharge with a splint, pain control, and follow up with hand specialist. Given tetanus immunization in the ED.    Audree CamelScott T Stran Raper, MD 07/21/14 [phone]

## 2014-07-21 NOTE — ED Notes (Signed)
Patient transported to X-ray 

## 2014-07-21 NOTE — Discharge Instructions (Signed)
Cast or Splint Care °Casts and splints support injured limbs and keep bones from moving while they heal. It is important to care for your cast or splint at home.   °HOME CARE INSTRUCTIONS °· Keep the cast or splint uncovered during the drying period. It can take 24 to 48 hours to dry if it is made of plaster. A fiberglass cast will dry in less than 1 hour. °· Do not rest the cast on anything harder than a pillow for the first 24 hours. °· Do not put weight on your injured limb or apply pressure to the cast until your health care provider gives you permission. °· Keep the cast or splint dry. Wet casts or splints can lose their shape and may not support the limb as well. A wet cast that has lost its shape can also create harmful pressure on your skin when it dries. Also, wet skin can become infected. °· Cover the cast or splint with a plastic bag when bathing or when out in the rain or snow. If the cast is on the trunk of the body, take sponge baths until the cast is removed. °· If your cast does become wet, dry it with a towel or a blow dryer on the cool setting only. °· Keep your cast or splint clean. Soiled casts may be wiped with a moistened cloth. °· Do not place any hard or soft foreign objects under your cast or splint, such as cotton, toilet paper, lotion, or powder. °· Do not try to scratch the skin under the cast with any object. The object could get stuck inside the cast. Also, scratching could lead to an infection. If itching is a problem, use a blow dryer on a cool setting to relieve discomfort. °· Do not trim or cut your cast or remove padding from inside of it. °· Exercise all joints next to the injury that are not immobilized by the cast or splint. For example, if you have a long leg cast, exercise the hip joint and toes. If you have an arm cast or splint, exercise the shoulder, elbow, thumb, and fingers. °· Elevate your injured arm or leg on 1 or 2 pillows for the first 1 to 3 days to decrease  swelling and pain. It is best if you can comfortably elevate your cast so it is higher than your heart. °SEEK MEDICAL CARE IF:  °· Your cast or splint cracks. °· Your cast or splint is too tight or too loose. °· You have unbearable itching inside the cast. °· Your cast becomes wet or develops a soft spot or area. °· You have a bad smell coming from inside your cast. °· You get an object stuck under your cast. °· Your skin around the cast becomes red or raw. °· You have new pain or worsening pain after the cast has been applied. °SEEK IMMEDIATE MEDICAL CARE IF:  °· You have fluid leaking through the cast. °· You are unable to move your fingers or toes. °· You have discolored (blue or white), cool, painful, or very swollen fingers or toes beyond the cast. °· You have tingling or numbness around the injured area. °· You have severe pain or pressure under the cast. °· You have any difficulty with your breathing or have shortness of breath. °· You have chest pain. °Document Released: 07/25/2000 Document Revised: 05/18/2013 Document Reviewed: 02/03/2013 °[employer]® Patient Information ©2015 [employer]. This information is not intended to replace advice given to you by your health care   provider. Make sure you discuss any questions you have with your health care provider. ° °Finger Fracture °Fractures of fingers are breaks in the bones of the fingers. There are many types of fractures. There are different ways of treating these fractures. Your health care provider will discuss the best way to treat your fracture. °CAUSES °Traumatic injury is the main cause of broken fingers. These include: °· Injuries while playing sports. °· Workplace injuries. °· Falls. °RISK FACTORS °Activities that can increase your risk of finger fractures include: °· Sports. °· Workplace activities that involve machinery. °· A condition called osteoporosis, which can make your bones less dense and cause them to fracture more easily. °SIGNS AND  SYMPTOMS °The main symptoms of a broken finger are pain and swelling within 15 minutes after the injury. Other symptoms include: °· Bruising of your finger. °· Stiffness of your finger. °· Numbness of your finger. °· Exposed bones (compound fracture) if the fracture is severe. °DIAGNOSIS  °The best way to diagnose a broken bone is with X-ray imaging. Additionally, your health care provider will use this X-ray image to evaluate the position of the broken finger bones.  °TREATMENT  °Finger fractures can be treated with:  °· Nonreduction--This means the bones are in place. The finger is splinted without changing the positions of the bone pieces. The splint is usually left on for about a week to 10 days. This will depend on your fracture and what your health care provider thinks. °· Closed reduction--The bones are put back into position without using surgery. The finger is then splinted. °· Open reduction and internal fixation--The fracture site is opened. Then the bone pieces are fixed into place with pins or some type of hardware. This is seldom required. It depends on the severity of the fracture. °HOME CARE INSTRUCTIONS  °· Follow your health care provider's instructions regarding activities, exercises, and physical therapy. °· Only take over-the-counter or prescription medicines for pain, discomfort, or fever as directed by your health care provider. °SEEK MEDICAL CARE IF: °You have pain or swelling that limits the motion or use of your fingers. °SEEK IMMEDIATE MEDICAL CARE IF:  °Your finger becomes numb. °MAKE SURE YOU:  °· Understand these instructions. °· Will watch your condition. °· Will get help right away if you are not doing well or get worse. °Document Released: 11/09/2000 Document Revised: 05/18/2013 Document Reviewed: 03/09/2013 °[employer]® Patient Information ©2015 [employer]. This information is not intended to replace advice given to you by your health care provider. Make sure you discuss any  questions you have with your health care provider. ° °

## 2014-07-21 NOTE — ED Notes (Signed)
Pt states he was at work yesterday 1730 and smashed his finger " Lt small finger"  on loading dock. Pt states he did not come in because, " he thought it was a clean break."

## 2015-04-05 ENCOUNTER — Encounter (HOSPITAL_COMMUNITY): Payer: Self-pay | Admitting: *Deleted

## 2015-04-05 ENCOUNTER — Emergency Department (INDEPENDENT_AMBULATORY_CARE_PROVIDER_SITE_OTHER)
Admission: EM | Admit: 2015-04-05 | Discharge: 2015-04-05 | Disposition: A | Discharge status: Home / Self Care | Payer: Self-pay | Source: Home / Self Care | Attending: Family Medicine | Admitting: Family Medicine

## 2015-04-05 DIAGNOSIS — S39012A Strain of muscle, fascia and tendon of lower back, initial encounter: Secondary | ICD-10-CM

## 2015-04-05 DIAGNOSIS — S29012A Strain of muscle and tendon of back wall of thorax, initial encounter: Secondary | ICD-10-CM

## 2015-04-05 MED ORDER — KETOROLAC TROMETHAMINE 30 MG/ML IJ SOLN
INTRAMUSCULAR | Status: AC
  Filled 2015-04-05: qty 1

## 2015-04-05 MED ORDER — KETOROLAC TROMETHAMINE 30 MG/ML IJ SOLN
30.0000 mg | Freq: Once | INTRAMUSCULAR | Status: AC
  Administered 2015-04-05: 30 mg via INTRAMUSCULAR

## 2015-04-05 MED ORDER — CYCLOBENZAPRINE HCL 5 MG PO TABS: 5.0000 mg | ORAL_TABLET | Freq: Three times a day (TID) | ORAL | Status: DC | PRN

## 2015-04-05 MED ORDER — DICLOFENAC POTASSIUM 50 MG PO TABS: 50.0000 mg | ORAL_TABLET | Freq: Three times a day (TID) | ORAL | Status: DC

## 2015-04-05 NOTE — ED Provider Notes (Addendum)
CSN: [id number]     Arrival date & time 04/05/15  1906 History   First MD Initiated Contact with Patient 04/05/15 1918     Chief Complaint  Patient presents with  . Back Pain   (Consider location/radiation/quality/duration/timing/severity/associated sxs/prior Treatment) Patient is a 24 y.o. male presenting with back pain. The history is provided by the patient.  Back Pain Location:  Thoracic spine and lumbar spine Quality:  Stiffness and shooting Radiates to:  Does not radiate Pain severity:  Moderate Onset quality:  Sudden Duration:  3 hours Progression:  Unchanged Chronicity:  New Context comment:  Onset after taking a nap on couch today. Relieved by:  None tried Worsened by:  Nothing tried Ineffective treatments:  None tried Associated symptoms: no abdominal pain, no bladder incontinence, no bowel incontinence, no dysuria, no fever, no leg pain, no numbness, no paresthesias, no tingling and no weakness     History reviewed. No pertinent past medical history. History reviewed. No pertinent past surgical history. History reviewed. No pertinent family history. Social History  Substance Use Topics  . Smoking status: Current Every Day Smoker  . Smokeless tobacco: None  . Alcohol Use: No    Review of Systems  Constitutional: Negative.  Negative for fever.  Gastrointestinal: Negative.  Negative for abdominal pain and bowel incontinence.  Genitourinary: Negative.  Negative for bladder incontinence and dysuria.  Musculoskeletal: Positive for myalgias, back pain and gait problem. Negative for joint swelling.  Skin: Negative.   Neurological: Negative for tingling, weakness, numbness and paresthesias.    Allergies  Review of patient's allergies indicates no known allergies.  Home Medications   Prior to Admission medications   Medication Sig Start Date End Date Taking? Authorizing Provider  cyclobenzaprine (FLEXERIL) 5 MG tablet Take 1 tablet (5 mg total) by mouth 3 (three)  times daily as needed for muscle spasms. 04/05/15   Linna Hoff, MD  diclofenac (CATAFLAM) 50 MG tablet Take 1 tablet (50 mg total) by mouth 3 (three) times daily. For back pain 04/05/15   Linna Hoff, MD  oxyCODONE-acetaminophen (PERCOCET) 5-325 MG per tablet Take 1 tablet by mouth every 8 (eight) hours as needed for severe pain. 07/21/14   Pricilla Loveless, MD   Meds Ordered and Administered this Visit   Medications  ketorolac (TORADOL) 30 MG/ML injection 30 mg (30 mg Intramuscular Given 04/05/15 1944)    BP 125/76 mmHg  Pulse 76  Temp(Src) 98.3 F (36.8 C) (Oral)  Resp 18  SpO2 98% No data found.   Physical Exam  Constitutional: He is oriented to person, place, and time. He appears well-developed and well-nourished.  Abdominal: Soft. Bowel sounds are normal. There is no tenderness.  Musculoskeletal: He exhibits tenderness.       Thoracic back: He exhibits decreased range of motion, tenderness, pain and spasm.       Back:  Neurological: He is alert and oriented to person, place, and time.  Skin: Skin is warm and dry.  Nursing note and vitals reviewed.   ED Course  Procedures (including critical care time)  Labs Review Labs Reviewed - No data to display  Imaging Review No results found.   Visual Acuity Review  Right Eye Distance:   Left Eye Distance:   Bilateral Distance:    Right Eye Near:   Left Eye Near:    Bilateral Near:         MDM   1. Upper back strain, initial encounter   2. Low back strain, initial  encounter        Linna Hoff, MD 04/05/15 1944  Linna Hoff, MD 04/05/15 1946

## 2015-04-05 NOTE — ED Notes (Signed)
Pt  Reports    Back  Pain  With  Pain  radiatiing  Down l  Side  Of his  Body    -  He  States   He  Woke  Up  With  The   Symptoms  This  Am  After  Taking a  Nap  On  His     Couch      He  denys     Any     Shortness of  Breath

## 2015-05-17 ENCOUNTER — Emergency Department (HOSPITAL_COMMUNITY): Payer: Self-pay

## 2015-05-17 ENCOUNTER — Encounter (HOSPITAL_COMMUNITY): Payer: Self-pay | Admitting: Emergency Medicine

## 2015-05-17 ENCOUNTER — Emergency Department (HOSPITAL_COMMUNITY)
Admission: EM | Admit: 2015-05-17 | Discharge: 2015-05-17 | Disposition: A | Discharge status: Home / Self Care | Payer: Self-pay | Attending: Emergency Medicine | Admitting: Emergency Medicine

## 2015-05-17 DIAGNOSIS — Y92410 Unspecified street and highway as the place of occurrence of the external cause: Secondary | ICD-10-CM | POA: Insufficient documentation

## 2015-05-17 DIAGNOSIS — Y9389 Activity, other specified: Secondary | ICD-10-CM | POA: Insufficient documentation

## 2015-05-17 DIAGNOSIS — S6992XA Unspecified injury of left wrist, hand and finger(s), initial encounter: Secondary | ICD-10-CM | POA: Insufficient documentation

## 2015-05-17 DIAGNOSIS — S199XXA Unspecified injury of neck, initial encounter: Secondary | ICD-10-CM | POA: Insufficient documentation

## 2015-05-17 DIAGNOSIS — Z79899 Other long term (current) drug therapy: Secondary | ICD-10-CM | POA: Insufficient documentation

## 2015-05-17 DIAGNOSIS — M542 Cervicalgia: Secondary | ICD-10-CM

## 2015-05-17 DIAGNOSIS — Y998 Other external cause status: Secondary | ICD-10-CM | POA: Insufficient documentation

## 2015-05-17 DIAGNOSIS — Z720 Tobacco use: Secondary | ICD-10-CM | POA: Insufficient documentation

## 2015-05-17 DIAGNOSIS — S299XXA Unspecified injury of thorax, initial encounter: Secondary | ICD-10-CM | POA: Insufficient documentation

## 2015-05-17 DIAGNOSIS — M79642 Pain in left hand: Secondary | ICD-10-CM

## 2015-05-17 MED ORDER — NAPROXEN 500 MG PO TABS: 500.0000 mg | ORAL_TABLET | Freq: Two times a day (BID) | ORAL | Status: DC

## 2015-05-17 MED ORDER — CYCLOBENZAPRINE HCL 10 MG PO TABS: 10.0000 mg | ORAL_TABLET | Freq: Two times a day (BID) | ORAL | Status: DC | PRN
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
## 2015-05-17 NOTE — Discharge Instructions (Signed)
X-rays were normal. Ice pack. Medication for pain and muscle spasm. You will be sore for several more days.

## 2015-10-09 ENCOUNTER — Emergency Department (INDEPENDENT_AMBULATORY_CARE_PROVIDER_SITE_OTHER)
Admission: EM | Admit: 2015-10-09 | Discharge: 2015-10-09 | Disposition: A | Discharge status: Home / Self Care | Payer: Self-pay | Source: Home / Self Care | Attending: Emergency Medicine | Admitting: Emergency Medicine

## 2015-10-09 ENCOUNTER — Encounter (HOSPITAL_COMMUNITY): Payer: Self-pay | Admitting: Emergency Medicine

## 2015-10-09 DIAGNOSIS — J020 Streptococcal pharyngitis: Secondary | ICD-10-CM

## 2015-10-09 MED ORDER — AMOXICILLIN 400 MG/5ML PO SUSR: 500.0000 mg | Freq: Three times a day (TID) | ORAL | Status: DC

## 2015-10-09 MED ORDER — AMOXICILLIN 400 MG/5ML PO SUSR
500.0000 mg | Freq: Three times a day (TID) | ORAL | Status: AC
Start: 2015-10-09 — End: 2015-10-16

## 2015-10-09 NOTE — Discharge Instructions (Signed)
You have strep throat. Take amoxicillin 3 times a day for 10 days. You can use salt gargles, tea with honey, Chloraseptic spray, or Cepacol lozenges to help with throat pain. Tylenol or ibuprofen as needed for fever or chills. You should see improvement in the next 24 hours. Follow-up as needed.

## 2015-10-09 NOTE — ED Notes (Signed)
Sore throat started Monday, denies fever.  No runny nose, denies ear pain.  Patient reports a slight cough.

## 2015-10-09 NOTE — ED Provider Notes (Signed)
CSN: [id number]     Arrival date & time 10/09/15  1448 History   First MD Initiated Contact with Patient 10/09/15 1636     Chief Complaint  Patient presents with  . Sore Throat   (Consider location/radiation/quality/duration/timing/severity/associated sxs/prior Treatment) HPI  He is a 25 year old man here for evaluation of sore throat. This started yesterday afternoon with sore throat, worse with swallowing. He denies any nasal congestion or rhinorrhea. He's been having chills, but denies fever. No headache. No nausea or vomiting. He does report a mild cough, but it just started while at the urgent care. No known sick contacts.  History reviewed. No pertinent past medical history. Past Surgical History  Procedure Laterality Date  . Broken finger     No family history on file. Social History  Substance Use Topics  . Smoking status: Current Every Day Smoker  . Smokeless tobacco: None  . Alcohol Use: Yes    Review of Systems As in history of present illness Allergies  Review of patient's allergies indicates no known allergies.  Home Medications   Prior to Admission medications   Medication Sig Start Date End Date Taking? Authorizing Provider  amoxicillin (AMOXIL) 400 MG/5ML suspension Take 6.3 mLs (500 mg total) by mouth 3 (three) times daily. For 10 days 10/09/15 10/16/15  Charm Rings, MD  cyclobenzaprine (FLEXERIL) 10 MG tablet Take 1 tablet (10 mg total) by mouth 2 (two) times daily as needed for muscle spasms. 05/17/15   Donnetta Hutching, MD  diclofenac (CATAFLAM) 50 MG tablet Take 1 tablet (50 mg total) by mouth 3 (three) times daily. For back pain 04/05/15   Linna Hoff, MD  naproxen (NAPROSYN) 500 MG tablet Take 1 tablet (500 mg total) by mouth 2 (two) times daily. 05/17/15   Donnetta Hutching, MD  oxyCODONE-acetaminophen (PERCOCET) 5-325 MG per tablet Take 1 tablet by mouth every 8 (eight) hours as needed for severe pain. 07/21/14   Pricilla Loveless, MD   Meds Ordered and Administered this  Visit  Medications - No data to display  BP 147/75 mmHg  Pulse 80  Temp(Src) 99.1 F (37.3 C) (Oral)  Resp 16  SpO2 100% No data found.   Physical Exam  Constitutional: He is oriented to person, place, and time. He appears well-developed and well-nourished. No distress.  HENT:  Mouth/Throat: Oropharyngeal exudate present.  Tonsils are erythematous with exudate  Neck: Neck supple.  Cardiovascular: Normal rate, regular rhythm and normal heart sounds.   No murmur heard. Pulmonary/Chest: Effort normal and breath sounds normal. No respiratory distress. He has no wheezes. He has no rales.  Lymphadenopathy:    He has cervical adenopathy.  Neurological: He is alert and oriented to person, place, and time.    ED Course  Procedures (including critical care time)  Labs Review Labs Reviewed - No data to display  Imaging Review No results found.   MDM   1. Strep pharyngitis    Clinical exam consistent with strep. We'll treat with amoxicillin. Symptomatic treatment discussed. Work note provided. Follow-up as needed.    Charm Rings, MD 10/09/15 [phone]

## 2016-07-15 ENCOUNTER — Encounter (HOSPITAL_COMMUNITY): Payer: Self-pay | Admitting: Emergency Medicine

## 2016-07-15 ENCOUNTER — Ambulatory Visit (HOSPITAL_COMMUNITY)
Admission: EM | Admit: 2016-07-15 | Discharge: 2016-07-15 | Disposition: A | Discharge status: Home / Self Care | Payer: BLUE CROSS/BLUE SHIELD | Attending: Family Medicine | Admitting: Family Medicine

## 2016-07-15 DIAGNOSIS — J069 Acute upper respiratory infection, unspecified: Secondary | ICD-10-CM

## 2016-07-15 MED ORDER — HYDROCOD POLST-CPM POLST ER 10-8 MG/5ML PO SUER: 5.0000 mL | Freq: Two times a day (BID) | ORAL | 0 refills | Status: DC | PRN

## 2016-07-15 MED ORDER — AMOXICILLIN-POT CLAVULANATE 875-125 MG PO TABS: 1.0000 | ORAL_TABLET | Freq: Two times a day (BID) | ORAL | 0 refills | Status: DC

## 2016-07-15 NOTE — Discharge Instructions (Signed)
Drink plenty of fluids as discussed, use medicine as prescribed, and mucinex or delsym for cough. Return or see your doctor if further problems °

## 2016-07-15 NOTE — ED Triage Notes (Signed)
Cough, runny nose for a week, hoarseness.  Denies fever.

## 2016-07-15 NOTE — ED Provider Notes (Signed)
[facility]    CSN: [id number] Arrival date & time: 07/15/16  1653     History   Chief Complaint Chief Complaint  Patient presents with  . Cough    HPI Tony Carr is a 25 y.o. male.   The history is provided by the patient.  Cough  Cough characteristics:  Non-productive and dry Severity:  Mild Onset quality:  Sudden Duration:  1 week Progression:  Worsening Chronicity:  New Smoker: no   Context: upper respiratory infection and weather changes   Relieved by:  None tried Worsened by:  Nothing Ineffective treatments:  None tried Associated symptoms: rhinorrhea   Associated symptoms: no chills, no fever, no rash, no shortness of breath, no sinus congestion and no wheezing     History reviewed. No pertinent past medical history.  There are no active problems to display for this patient.   Past Surgical History:  Procedure Laterality Date  . broken finger         Home Medications    Prior to Admission medications   Medication Sig Start Date End Date Taking? Authorizing Provider  cyclobenzaprine (FLEXERIL) 10 MG tablet Take 1 tablet (10 mg total) by mouth 2 (two) times daily as needed for muscle spasms. 05/17/15   Donnetta HutchingBrian Cook, MD  diclofenac (CATAFLAM) 50 MG tablet Take 1 tablet (50 mg total) by mouth 3 (three) times daily. For back pain 04/05/15   Linna HoffJames D Kindl, MD  naproxen (NAPROSYN) 500 MG tablet Take 1 tablet (500 mg total) by mouth 2 (two) times daily. 05/17/15   Donnetta HutchingBrian Cook, MD  oxyCODONE-acetaminophen (PERCOCET) 5-325 MG per tablet Take 1 tablet by mouth every 8 (eight) hours as needed for severe pain. 07/21/14   Pricilla LovelessScott Goldston, MD    Family History No family history on file.  Social History Social History  Substance Use Topics  . Smoking status: Former Games developermoker  . Smokeless tobacco: Not on file  . Alcohol use Yes     Allergies   Patient has no known allergies.   Review of Systems Review of Systems  Constitutional: Negative.  Negative  for chills and fever.  HENT: Positive for congestion, postnasal drip and rhinorrhea.   Respiratory: Positive for cough. Negative for shortness of breath and wheezing.   Cardiovascular: Negative.   Genitourinary: Negative.   Skin: Negative for rash.  Neurological: Negative.      Physical Exam Triage Vital Signs ED Triage Vitals  Enc Vitals Group     BP      Pulse      Resp      Temp      Temp src      SpO2      Weight      Height      Head Circumference      Peak Flow      Pain Score      Pain Loc      Pain Edu?      Excl. in GC?    No data found.   Updated Vital Signs There were no vitals taken for this visit.  Visual Acuity Right Eye Distance:   Left Eye Distance:   Bilateral Distance:    Right Eye Near:   Left Eye Near:    Bilateral Near:     Physical Exam   UC Treatments / Results  Labs (all labs ordered are listed, but only abnormal results are displayed) Labs Reviewed - No data to display  EKG  EKG  Interpretation None       Radiology No results found.  Procedures Procedures (including critical care time)  Medications Ordered in UC Medications - No data to display   Initial Impression / Assessment and Plan / UC Course  I have reviewed the triage vital signs and the nursing notes.  Pertinent labs & imaging results that were available during my care of the patient were reviewed by me and considered in my medical decision making (see chart for details).  Clinical Course       Final Clinical Impressions(s) / UC Diagnoses   Final diagnoses:  None    New Prescriptions New Prescriptions   No medications on file     Linna HoffJames D Kindl, MD 07/15/16 1752

## 2017-03-25 ENCOUNTER — Emergency Department (HOSPITAL_COMMUNITY)
Admission: EM | Admit: 2017-03-25 | Discharge: 2017-03-25 | Disposition: A | Discharge status: Home / Self Care | Payer: BLUE CROSS/BLUE SHIELD | Attending: Physician Assistant | Admitting: Physician Assistant

## 2017-03-25 ENCOUNTER — Encounter (HOSPITAL_COMMUNITY): Payer: Self-pay | Admitting: Emergency Medicine

## 2017-03-25 DIAGNOSIS — Z87891 Personal history of nicotine dependence: Secondary | ICD-10-CM | POA: Diagnosis not present

## 2017-03-25 DIAGNOSIS — M545 Low back pain, unspecified: Secondary | ICD-10-CM

## 2017-03-25 DIAGNOSIS — R600 Localized edema: Secondary | ICD-10-CM | POA: Diagnosis present

## 2017-03-25 DIAGNOSIS — T7840XA Allergy, unspecified, initial encounter: Secondary | ICD-10-CM

## 2017-03-25 DIAGNOSIS — T7804XA Anaphylactic reaction due to fruits and vegetables, initial encounter: Secondary | ICD-10-CM | POA: Diagnosis not present

## 2017-03-25 HISTORY — DX: Bronchitis, not specified as acute or chronic: J40

## 2017-03-25 MED ORDER — PREDNISONE 20 MG PO TABS: 40.0000 mg | ORAL_TABLET | Freq: Every day | ORAL | 0 refills | Status: DC

## 2017-03-25 MED ORDER — FAMOTIDINE 20 MG PO TABS
20.0000 mg | ORAL_TABLET | Freq: Once | ORAL | Status: AC
  Administered 2017-03-25: 20 mg via ORAL
  Filled 2017-03-25: qty 1

## 2017-03-25 MED ORDER — CYCLOBENZAPRINE HCL 10 MG PO TABS: 10.0000 mg | ORAL_TABLET | Freq: Three times a day (TID) | ORAL | 0 refills | Status: DC | PRN

## 2017-03-25 MED ORDER — PREDNISONE 20 MG PO TABS
60.0000 mg | ORAL_TABLET | Freq: Once | ORAL | Status: AC
  Administered 2017-03-25: 60 mg via ORAL
  Filled 2017-03-25: qty 3

## 2017-03-25 NOTE — Discharge Instructions (Signed)
Please take the prednisone starting tomorrow for 4 days. You may also take over the counter benadryl and pepcid to help. If you develop any shortness of breath, difficulty breathing or swallowing  return to the ED.  For your back take the flexeril to help with muscle spasm. Apply heat. Perform back stretches. Take motrin or aleve and tylenol for pain Follow up with ortho if symptoms persists.   Workup has been normal. Please take medications as prescribed and instructed.  SEEK IMMEDIATE MEDICAL ATTENTION IF: New numbness, tingling, weakness, or problem with the use of your arms or legs.  Severe back pain not relieved with medications.  Change in bowel or bladder control.  Urinary retention.  Numbness in your groin.  Increasing pain in any areas of the body (such as chest or abdominal pain).  Shortness of breath, dizziness or fainting.  Nausea (feeling sick to your stomach), vomiting, fever, or sweats.

## 2017-03-25 NOTE — ED Triage Notes (Addendum)
Pt reports yesterday that he ate some grapes and strawberries from [employer], at one time he was holding a strawberry in his mouth. Pt noticed shortly after that he had a ring shaped redness on both of his lips where he was holding the strawberry. Pt states that his cousin didn't wash the fruit off. Pt also reports left sided back pain that shoots to his left leg over the past month that has gotten worse.
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
## 2017-04-14 ENCOUNTER — Emergency Department (HOSPITAL_COMMUNITY)
Admission: EM | Admit: 2017-04-14 | Discharge: 2017-04-15 | Disposition: A | Discharge status: Home / Self Care | Payer: BLUE CROSS/BLUE SHIELD | Attending: Emergency Medicine | Admitting: Emergency Medicine

## 2017-04-14 DIAGNOSIS — Y999 Unspecified external cause status: Secondary | ICD-10-CM | POA: Insufficient documentation

## 2017-04-14 DIAGNOSIS — M545 Low back pain, unspecified: Secondary | ICD-10-CM

## 2017-04-14 DIAGNOSIS — Y939 Activity, unspecified: Secondary | ICD-10-CM | POA: Diagnosis not present

## 2017-04-14 DIAGNOSIS — Y929 Unspecified place or not applicable: Secondary | ICD-10-CM | POA: Insufficient documentation

## 2017-04-14 DIAGNOSIS — Z87891 Personal history of nicotine dependence: Secondary | ICD-10-CM | POA: Insufficient documentation

## 2017-04-14 DIAGNOSIS — X509XXA Other and unspecified overexertion or strenuous movements or postures, initial encounter: Secondary | ICD-10-CM | POA: Insufficient documentation

## 2017-04-14 DIAGNOSIS — S1980XA Other specified injuries of unspecified part of neck, initial encounter: Secondary | ICD-10-CM | POA: Diagnosis present

## 2017-04-14 DIAGNOSIS — S161XXA Strain of muscle, fascia and tendon at neck level, initial encounter: Secondary | ICD-10-CM

## 2017-04-14 NOTE — ED Triage Notes (Signed)
Pt reports worsening L side back pain, which he was seen for [facility] weeks ago.  States he was given rx for muscle relaxer and prednisone without relief.  Reports he did not finish taking the prednisone because he did not think that it's working.  Pt reports the pain is radiating up to his neck causing numbness in his L arm this am when he awoke.  Pt also reports pain radiates to his L hip.  States the pain is severe when "it catches."

## 2017-04-15 ENCOUNTER — Emergency Department (HOSPITAL_COMMUNITY): Payer: BLUE CROSS/BLUE SHIELD

## 2017-04-15 LAB — URINALYSIS, ROUTINE W REFLEX MICROSCOPIC
Bilirubin Urine: NEGATIVE
Glucose, UA: NEGATIVE mg/dL
Hgb urine dipstick: NEGATIVE
KETONES UR: NEGATIVE mg/dL
LEUKOCYTES UA: NEGATIVE
NITRITE: NEGATIVE
Protein, ur: NEGATIVE mg/dL
Specific Gravity, Urine: 1.024 (ref 1.005–1.030)
pH: 5 (ref 5.0–8.0)

## 2017-04-15 MED ORDER — HYDROCODONE-ACETAMINOPHEN 5-325 MG PO TABS
1.0000 | ORAL_TABLET | Freq: Once | ORAL | Status: AC
  Administered 2017-04-15: 1 via ORAL
  Filled 2017-04-15: qty 1

## 2017-04-15 MED ORDER — IBUPROFEN 600 MG PO TABS: 600.0000 mg | ORAL_TABLET | Freq: Three times a day (TID) | ORAL | 0 refills | Status: AC | PRN

## 2017-04-15 NOTE — ED Provider Notes (Signed)
[facility] DEPT Provider Note   CSN: [id number] Arrival date & time: 04/14/17  2136     History   Chief Complaint Chief Complaint  Patient presents with  . Back Pain    HPI Tony Carr is a 26 y.o. male.  The history is provided by the patient.  Back Pain   This is a chronic problem. The current episode started more than 1 week ago. The problem occurs constantly. The problem has been gradually worsening. The pain is associated with lifting heavy objects. The pain is moderate. The symptoms are aggravated by twisting and bending. Pertinent negatives include no chest pain, no fever, no abdominal pain, no bowel incontinence and no bladder incontinence. He has tried muscle relaxants for the symptoms. The treatment provided mild relief.  pt reports long h/o neck and back pain for "awhile" due to previous injuries and heavy lifting at work He reports over past several days it has worsened One morning when he woke up he had neck pain and left arm numbness, now resolved No fever/vomiting No cp/sob No incontinence No previous surgery He has f/u later in the month He reports most of his pain is left neck and left side of back   Past Medical History:  Diagnosis Date  . Bronchitis     There are no active problems to display for this patient.   Past Surgical History:  Procedure Laterality Date  . broken finger         Home Medications    Prior to Admission medications   Medication Sig Start Date End Date Taking? Authorizing Provider  cyclobenzaprine (FLEXERIL) 10 MG tablet Take 1 tablet (10 mg total) by mouth 3 (three) times daily as needed for muscle spasms. 03/25/17  Yes Rise Mu, PA-C    Family History No family history on file.  Social History Social History  Substance Use Topics  . Smoking status: Former Games developer  . Smokeless tobacco: Never Used     Comment: occ.   Marland Kitchen Alcohol use Yes     Comment: seldom     Allergies   Patient has no known  allergies.   Review of Systems Review of Systems  Constitutional: Negative for fever.  Cardiovascular: Negative for chest pain.  Gastrointestinal: Negative for abdominal pain and bowel incontinence.  Genitourinary: Negative for bladder incontinence.  Musculoskeletal: Positive for back pain.  All other systems reviewed and are negative.    Physical Exam Updated Vital Signs BP 128/81 (BP Location: Left Arm)   Pulse 64   Temp 97.9 F (36.6 C) (Oral)   Resp 14   Ht 1.803 m (5\' 11" )   Wt 82.6 kg (182 lb)   SpO2 99%   BMI 25.38 kg/m   Physical Exam CONSTITUTIONAL: Well developed/well nourished HEAD: Normocephalic/atraumatic EYES: EOMI ENMT: Mucous membranes moist NECK: supple no meningeal signs SPINE/BACK:entire spine nontender, cervical and lumbar paraspinal tenderness, No bruising/crepitance/stepoffs noted to spine CV: S1/S2 noted, no murmurs/rubs/gallops noted LUNGS: Lungs are clear to auscultation bilaterally, no apparent distress ABDOMEN: soft, nontender, no rebound or guarding GU:no cva tenderness NEURO: Awake/alert, equal motor 5/5 strength noted with the following: hip flexion/knee flexion/extension, foot dorsi/plantar flexion,no sensory deficit in any dermatome.  Pt is able to ambulate unassisted. Equal power (5/5) with hand grip, wrist flex/extension, elbow flex/extension, and equal power with shoulder abduction/adduction.  No focal sensory deficit to light touch is noted in either UE.   Equal (2+) biceps/brachioradialis reflex in bilateral UE EXTREMITIES: pulses normal, full ROM SKIN: warm,  color normal PSYCH: no abnormalities of mood noted, alert and oriented to situation    ED Treatments / Results  Labs (all labs ordered are listed, but only abnormal results are displayed) Labs Reviewed  URINALYSIS, ROUTINE W REFLEX MICROSCOPIC    EKG  EKG Interpretation None       Radiology Dg Cervical Spine Complete  Result Date: 04/15/2017 CLINICAL DATA:   Cervical neck pain for several months. EXAM: CERVICAL SPINE - COMPLETE 4+ VIEW COMPARISON:  Radiographs 05/17/2015 FINDINGS: Stable straightening of normal lordosis. Vertebral body heights are preserved. The dens is intact. Posterior elements appear well-aligned. Chronic complete changes of C4-C5. Mild disc space loss at C5-C6 in chronic endplate spurring. There is no evidence of fracture. No prevertebral soft tissue edema. IMPRESSION: 1. No acute radiographic abnormality. 2. Chronic C4-C5 and C5-C6 endplate changes, stable from prior radiograph. Electronically Signed   By: Rubye OaksMelanie  Ehinger M.D.   On: 04/15/2017 02:49   Dg Lumbar Spine Complete  Result Date: 04/15/2017 CLINICAL DATA:  Lumbosacral back pain for months. EXAM: LUMBAR SPINE - COMPLETE 4+ VIEW COMPARISON:  None. FINDINGS: Trace retrolisthesis of L5 on S1. Alignment is otherwise maintained. Vertebral body heights are normal. There is no listhesis. The posterior elements are intact. Disc spaces are preserved. No fracture. Sacroiliac joints are symmetric and normal. IMPRESSION: Trace retrolisthesis of L5 on S1. Otherwise unremarkable radiographs of the lumbar spine. Electronically Signed   By: Rubye OaksMelanie  Ehinger M.D.   On: 04/15/2017 02:51    Procedures Procedures    Medications Ordered in ED Medications  HYDROcodone-acetaminophen (NORCO/VICODIN) 5-325 MG per tablet 1 tablet (1 tablet Oral Given 04/15/17 0213)     Initial Impression / Assessment and Plan / ED Course  I have reviewed the triage vital signs and the nursing notes.  Pertinent labs & imaging results that were available during my care of the patient were reviewed by me and considered in my medical decision making (see chart for details).     Pt improved No neuro deficits Will d/c home  we discussed strict ER return precautions   Final Clinical Impressions(s) / ED Diagnoses   Final diagnoses:  Acute left-sided low back pain without sciatica  Strain of neck muscle,  initial encounter    New Prescriptions New Prescriptions   IBUPROFEN (ADVIL,MOTRIN) 600 MG TABLET    Take 1 tablet (600 mg total) by mouth every 8 (eight) hours as needed for moderate pain.     Zadie RhineWickline, Pleshette Tomasini, MD 04/15/17 [phone]

## 2017-04-15 NOTE — Discharge Instructions (Signed)
You have neck pain, possibly from a cervical strain and/or pinched nerve.  ° °SEEK IMMEDIATE MEDICAL ATTENTION IF: °You develop difficulties swallowing or breathing.  °You have new or worse numbness, weakness, tingling, or movement problems in your arms or legs.  °You develop increasing pain which is uncontrolled with medications.  °You have change in bowel or bladder function, or other concerns. ° °SEEK IMMEDIATE MEDICAL ATTENTION IF: °New numbness, tingling, weakness, or problem with the use of your arms or legs.  °Severe back pain not relieved with medications.  °Change in bowel or bladder control (if you lose control of stool or urine, or if you are unable to urinate) °Increasing pain in any areas of the body (such as chest or abdominal pain).  °Shortness of breath, dizziness or fainting.  °Nausea (feeling sick to your stomach), vomiting, fever, or sweats. ° °

## 2017-04-28 ENCOUNTER — Ambulatory Visit (HOSPITAL_COMMUNITY)
Admission: EM | Admit: 2017-04-28 | Discharge: 2017-04-28 | Disposition: A | Discharge status: Home / Self Care | Payer: BLUE CROSS/BLUE SHIELD | Attending: Family Medicine | Admitting: Family Medicine

## 2017-04-28 ENCOUNTER — Encounter (HOSPITAL_COMMUNITY): Payer: Self-pay | Admitting: Emergency Medicine

## 2017-04-28 DIAGNOSIS — J069 Acute upper respiratory infection, unspecified: Secondary | ICD-10-CM

## 2017-04-28 DIAGNOSIS — B9789 Other viral agents as the cause of diseases classified elsewhere: Secondary | ICD-10-CM

## 2017-04-28 MED ORDER — HYDROCODONE-HOMATROPINE 5-1.5 MG/5ML PO SYRP: 5.0000 mL | ORAL_SOLUTION | Freq: Four times a day (QID) | ORAL | 0 refills | Status: DC | PRN

## 2017-04-28 NOTE — ED Triage Notes (Signed)
PT reports congestion, drainage, headache, facial pain for 2 days.

## 2017-04-28 NOTE — Discharge Instructions (Signed)
Be aware, your cough medication may cause drowsiness. Please do not drive, operate heavy machinery or make important decisions while on this medication, it can cloud your judgement.  

## 2017-04-29 NOTE — ED Provider Notes (Signed)
  [facility]   [id number] 04/28/17 Arrival Time: 1654  ASSESSMENT & PLAN:  1. Viral URI with cough     Meds ordered this encounter  Medications  . HYDROcodone-homatropine (HYCODAN) 5-1.5 MG/5ML syrup    Sig: Take 5 mLs by mouth every 6 (six) hours as needed for cough.    Dispense:  90 mL    Refill:  0   Medication sedation precautions. Ensure adequate fluid intake. OTC analgesics and symptom care as needed. May f/u here or PCP if needed. Reviewed expectations re: course of current medical issues. Questions answered. Outlined signs and symptoms indicating need for more acute intervention. Patient verbalized understanding. After Visit Summary given.   SUBJECTIVE:  Tony Carr is a 26 y.o. male who presents with complaint of nasal congestion, post-nasal drainage, and a persistent cough. Abrupt onset 2 days ago. Body aches. No fever reported. Cough affecting sleep now. No SOB or wheezing. Non-smoker. OTC medications without relief. No specific aggravating or alleviating factors reported.  ROS: As per HPI.   OBJECTIVE:  Vitals:   04/28/17 1715 04/28/17 1716  BP:  133/75  Pulse:  73  Resp:  16  Temp:  99.1 F (37.3 C)  SpO2:  99%  Weight: 182 lb (82.6 kg)   Height:  (1.803 m)     General appearance: alert; no distress HEENT: nasal congestion; clear runny nose; throat irritation secondary to post-nasal drainage Neck: supple without LAD Lungs: clear to auscultation bilaterally; dry cough Skin: warm and dry Psychological: alert and cooperative; normal mood and affect  No Known Allergies  Past Medical History:  Diagnosis Date  . Bronchitis    Social History   Social History  . Marital status: Single    Spouse name: N/A  . Number of children: N/A  . Years of education: N/A   Occupational History  . Not on file.   Social History Main Topics  . Smoking status: Former Games developer  . Smokeless tobacco: Never Used     Comment: occ.   Marland Kitchen Alcohol use  Yes     Comment: seldom  . Drug use: No     Comment: occ.   Marland Kitchen Sexual activity: Yes   Other Topics Concern  . Not on file   Social History Narrative  [facility]       Mardella Layman, MD 04/29/17 1147

## 2017-05-05 ENCOUNTER — Emergency Department (HOSPITAL_COMMUNITY)
Admission: EM | Admit: 2017-05-05 | Discharge: 2017-05-05 | Disposition: A | Discharge status: Home / Self Care | Payer: BLUE CROSS/BLUE SHIELD | Attending: Emergency Medicine | Admitting: Emergency Medicine

## 2017-05-05 ENCOUNTER — Encounter (HOSPITAL_COMMUNITY): Payer: Self-pay

## 2017-05-05 DIAGNOSIS — J209 Acute bronchitis, unspecified: Secondary | ICD-10-CM | POA: Diagnosis not present

## 2017-05-05 DIAGNOSIS — Z87891 Personal history of nicotine dependence: Secondary | ICD-10-CM | POA: Insufficient documentation

## 2017-05-05 DIAGNOSIS — J0100 Acute maxillary sinusitis, unspecified: Secondary | ICD-10-CM | POA: Insufficient documentation

## 2017-05-05 DIAGNOSIS — R05 Cough: Secondary | ICD-10-CM | POA: Diagnosis present

## 2017-05-05 MED ORDER — IPRATROPIUM-ALBUTEROL 0.5-2.5 (3) MG/3ML IN SOLN
3.0000 mL | Freq: Once | RESPIRATORY_TRACT | Status: AC
  Administered 2017-05-05: 3 mL via RESPIRATORY_TRACT
  Filled 2017-05-05: qty 3

## 2017-05-05 MED ORDER — AMOXICILLIN 500 MG PO CAPS: 1000.0000 mg | ORAL_CAPSULE | Freq: Two times a day (BID) | ORAL | 0 refills | Status: DC

## 2017-05-05 MED ORDER — PREDNISONE 20 MG PO TABS
60.0000 mg | ORAL_TABLET | Freq: Once | ORAL | Status: AC
  Administered 2017-05-05: 60 mg via ORAL
  Filled 2017-05-05: qty 3

## 2017-05-05 MED ORDER — AMOXICILLIN 500 MG PO CAPS
1000.0000 mg | ORAL_CAPSULE | Freq: Once | ORAL | Status: AC
  Administered 2017-05-05: 1000 mg via ORAL
  Filled 2017-05-05: qty 2

## 2017-05-05 MED ORDER — PREDNISONE 50 MG PO TABS: 50.0000 mg | ORAL_TABLET | Freq: Every day | ORAL | 0 refills | Status: DC

## 2017-05-05 MED ORDER — ALBUTEROL SULFATE HFA 108 (90 BASE) MCG/ACT IN AERS
2.0000 | INHALATION_SPRAY | RESPIRATORY_TRACT | Status: DC | PRN
  Administered 2017-05-05: 2 via RESPIRATORY_TRACT
  Filled 2017-05-05: qty 6.7

## 2017-05-05 NOTE — ED Notes (Signed)
Patient unable to sign discharge due to technical difficulty with signature pad

## 2017-05-05 NOTE — Discharge Instructions (Signed)
Use the inhaler - two puffs, every four hours, as needed for coughing or breathing difficulty.

## 2017-05-05 NOTE — ED Provider Notes (Signed)
[facility]-EMERGENCY DEPT Provider Note   CSN: [id number] Arrival date & time: 05/05/17  0225     History   Chief Complaint Chief Complaint  Patient presents with  . URI    HPI Tony Carr is a 26 y.o. male.  The history is provided by the patient.  He has had rhinorrhea, sore throat, cough for the last week. He was seen at urgent care and diagnosed with a viral upper respiratory infection and given a prescription for hydrocodone syrup. He initially felt better, but symptoms started getting worse yesterday. He continues to have yellowish and greenish rhinorrhea, and cough productive of greenish sputum.He has not had any dyspnea. He denies arthralgias or myalgias. He denies fever, chills, sweats. He admits to having had a sick contact.  Past Medical History:  Diagnosis Date  . Bronchitis     There are no active problems to display for this patient.   Past Surgical History:  Procedure Laterality Date  . broken finger         Home Medications    Prior to Admission medications   Medication Sig Start Date End Date Taking? Authorizing Provider  cyclobenzaprine (FLEXERIL) 10 MG tablet Take 1 tablet (10 mg total) by mouth 3 (three) times daily as needed for muscle spasms. 03/25/17   Rise Mu, PA-C  HYDROcodone-homatropine (HYCODAN) 5-1.5 MG/5ML syrup Take 5 mLs by mouth every 6 (six) hours as needed for cough. 04/28/17   Mardella Layman, MD  ibuprofen (ADVIL,MOTRIN) 600 MG tablet Take 1 tablet (600 mg total) by mouth every 8 (eight) hours as needed for moderate pain. 04/15/17   Zadie Rhine, MD    Family History History reviewed. No pertinent family history.  Social History Social History  Substance Use Topics  . Smoking status: Former Games developer  . Smokeless tobacco: Never Used     Comment: occ.   Marland Kitchen Alcohol use Yes     Comment: seldom     Allergies   Patient has no known allergies.   Review of Systems Review of Systems  All other systems reviewed and are  negative.    Physical Exam Updated Vital Signs BP 118/75 (BP Location: Right Arm)   Pulse 66   Temp 97.7 F (36.5 C) (Oral)   Resp 18   SpO2 100%   Physical Exam  Nursing note and vitals reviewed.  26 year old male, resting comfortably and in no acute distress. Vital signs are normal. Oxygen saturation is 100%, which is normal. Head is normocephalic and atraumatic. PERRLA, EOMI. Oropharynx is clear. There is mild to moderate tenderness to palpation over the maxillary sinuses. Neck is nontender and supple without adenopathy or JVD. Back is nontender and there is no CVA tenderness. Lungs have scattered expiratory wheezes without rales or rhonchi. Chest is nontender. Heart has regular rate and rhythm without murmur. Abdomen is soft, flat, nontender without masses or hepatosplenomegaly and peristalsis is normoactive. Extremities have no cyanosis or edema, full range of motion is present. Skin is warm and dry without rash. Neurologic: Mental status is normal, cranial nerves are intact, there are no motor or sensory deficits.  ED Treatments / Results    Procedures Procedures (including critical care time)  Medications Ordered in ED Medications  albuterol (PROVENTIL HFA;VENTOLIN HFA) 108 (90 Base) MCG/ACT inhaler 2 puff (not administered)  ipratropium-albuterol (DUONEB) 0.5-2.5 (3) MG/3ML nebulizer solution 3 mL (3 mLs Nebulization Given 05/05/17 0752)  amoxicillin (AMOXIL) capsule 1,000 mg (1,000 mg Oral Given 05/05/17 0752)  predniSONE (DELTASONE)  tablet 60 mg (60 mg Oral Given 05/05/17 0751)     Initial Impression / Assessment and Plan / ED Course  I have reviewed the triage vital signs and the nursing notes.  Respiratory tract infection with ongoing sinusitis and bronchitis. Because he has not improved, he is started on antibiotics. He is given a dose of amoxicillin here. He shows evidence of bronchospasm on examination here, so he is given a dose of prednisone, and is given a  nebulizer treatment with albuterol and ipratropium. Old records are reviewed confirming prior urgent care visit for viral upper respiratory infection.  He feels much better after above noted treatment. He is discharged with an albuterol inhaler, and prescriptions for amoxicillin and prednisone.  Final Clinical Impressions(s) / ED Diagnoses   Final diagnoses:  Acute bronchitis, unspecified organism  Acute non-recurrent maxillary sinusitis    New Prescriptions New Prescriptions   AMOXICILLIN (AMOXIL) 500 MG CAPSULE    Take 2 capsules (1,000 mg total) by mouth 2 (two) times daily.   PREDNISONE (DELTASONE) 50 MG TABLET    Take 1 tablet (50 mg total) by mouth daily.     Dione Booze, MD 05/05/17 [phone]

## 2018-09-21 ENCOUNTER — Encounter (HOSPITAL_COMMUNITY): Payer: Self-pay | Admitting: *Deleted

## 2018-09-21 ENCOUNTER — Emergency Department (HOSPITAL_COMMUNITY)
Admission: EM | Admit: 2018-09-21 | Discharge: 2018-09-21 | Disposition: A | Discharge status: Home / Self Care | Payer: BLUE CROSS/BLUE SHIELD | Attending: Emergency Medicine | Admitting: Emergency Medicine

## 2018-09-21 ENCOUNTER — Other Ambulatory Visit: Payer: Self-pay

## 2018-09-21 DIAGNOSIS — S01412D Laceration without foreign body of left cheek and temporomandibular area, subsequent encounter: Secondary | ICD-10-CM | POA: Insufficient documentation

## 2018-09-21 DIAGNOSIS — X58XXXD Exposure to other specified factors, subsequent encounter: Secondary | ICD-10-CM | POA: Insufficient documentation

## 2018-09-21 DIAGNOSIS — Z4802 Encounter for removal of sutures: Secondary | ICD-10-CM | POA: Insufficient documentation

## 2018-09-21 DIAGNOSIS — Z79899 Other long term (current) drug therapy: Secondary | ICD-10-CM | POA: Insufficient documentation

## 2018-09-21 NOTE — Discharge Instructions (Signed)
Please read and follow all provided instructions.  Your diagnoses today include:  1. Visit for suture removal     Tests performed today include:  Vital signs. See below for your results today.   Medications prescribed:   None  Home care instructions:  Follow any educational materials contained in this packet.  Follow-up instructions: Please follow-up with your primary care provider as needed for further evaluation of your symptoms.  Return instructions:   Please return to the Emergency Department if you experience worsening symptoms.   Please return if you have any other emergent concerns.  Additional Information:  Your vital signs today were: BP (!) 128/97 (BP Location: Right Arm)    Pulse (!) 55    Temp 97.6 F (36.4 C) (Oral)    Resp 16    Ht 5\' 11"  (1.803 m)    Wt 81.6 kg    SpO2 100%    BMI 25.10 kg/m  If your blood pressure (BP) was elevated above 135/85 this visit, please have this repeated by your doctor within one month. ---------------
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
## 2021-02-22 ENCOUNTER — Encounter (HOSPITAL_COMMUNITY): Payer: Self-pay | Admitting: Emergency Medicine

## 2021-02-22 ENCOUNTER — Emergency Department (HOSPITAL_COMMUNITY): Payer: Self-pay

## 2021-02-22 ENCOUNTER — Other Ambulatory Visit: Payer: Self-pay

## 2021-02-22 ENCOUNTER — Emergency Department (HOSPITAL_COMMUNITY)
Admission: EM | Admit: 2021-02-22 | Discharge: 2021-02-22 | Disposition: A | Discharge status: Home / Self Care | Payer: Self-pay | Attending: Emergency Medicine | Admitting: Emergency Medicine

## 2021-02-22 DIAGNOSIS — M25551 Pain in right hip: Secondary | ICD-10-CM | POA: Insufficient documentation

## 2021-02-22 DIAGNOSIS — Z5321 Procedure and treatment not carried out due to patient leaving prior to being seen by health care provider: Secondary | ICD-10-CM | POA: Insufficient documentation
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
## 2021-02-22 NOTE — ED Notes (Signed)
Pt called to be taken back. No response
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
## 2021-02-23 ENCOUNTER — Encounter (HOSPITAL_COMMUNITY): Payer: Self-pay | Admitting: *Deleted

## 2021-02-23 ENCOUNTER — Ambulatory Visit (HOSPITAL_COMMUNITY)
Admission: EM | Admit: 2021-02-23 | Discharge: 2021-02-23 | Disposition: A | Discharge status: Home / Self Care | Payer: Self-pay

## 2021-02-23 ENCOUNTER — Other Ambulatory Visit: Payer: Self-pay

## 2021-02-23 DIAGNOSIS — M25551 Pain in right hip: Secondary | ICD-10-CM

## 2021-02-23 NOTE — ED Notes (Signed)
Patient demanding to be rolled to lobby, staff member rolled patient to lobby

## 2021-02-23 NOTE — Discharge Instructions (Addendum)
-  These are your xray results, R hip/pelvis: 1. No acute bony abnormalities. -Your exam was normal today. This is great news- but if your symptoms persist, please follow-up with [facility] below. You can call them, schedule this online, or go to their walk-in clinic Monday through Friday. -For pain: Take Tylenol 1000 mg 3 times daily, and ibuprofen 800 mg 3 times daily with food.  You can take these together, or alternate every 3-4 hours. -If you develop severe pain, or the worst pain of your life, head to the emergency department.

## 2021-02-23 NOTE — ED Triage Notes (Signed)
Pt seen at [facility] ED yesterday and left after X-ray was done. Pt presents here today and wants provider to look at x rays done yesterday. Pt using crutches on arrival to [facility].
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
## 2023-05-06 IMAGING — CR DG HIP (WITH OR WITHOUT PELVIS) 2-3V*R*
3 series · 3 of 3 positions shown · non-contrast
Comparison: None.

CLINICAL DATA: Right hip pain

EXAM:
DG HIP (WITH OR WITHOUT PELVIS) 2-3V RIGHT

[pelvis ap]
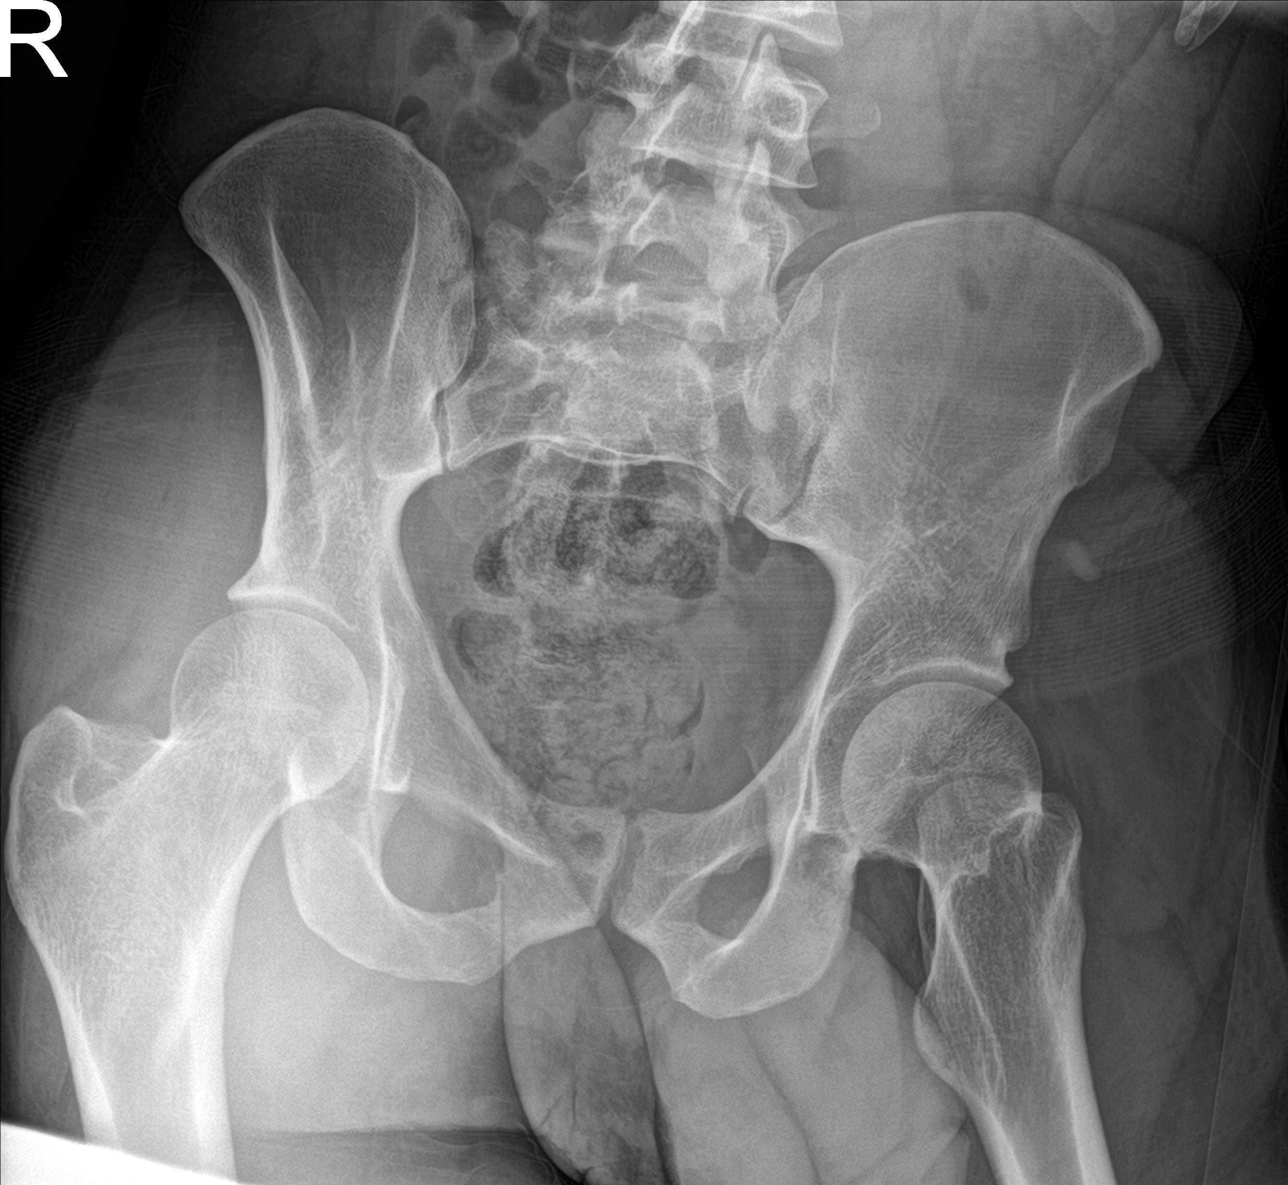

[hip ap]
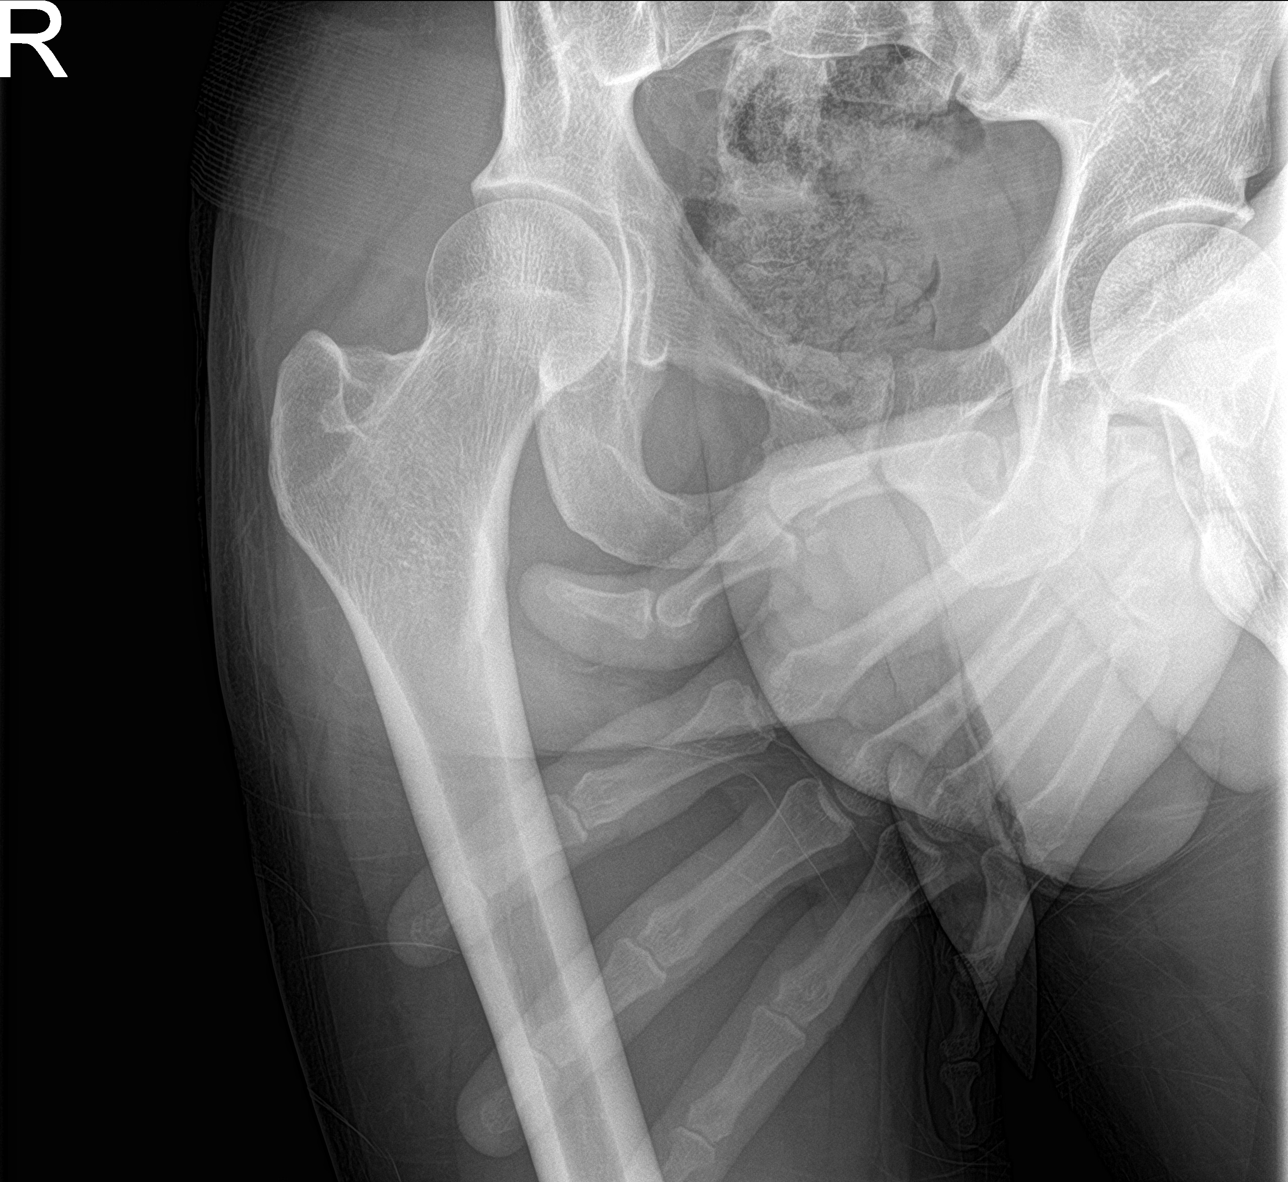

[hip lat]
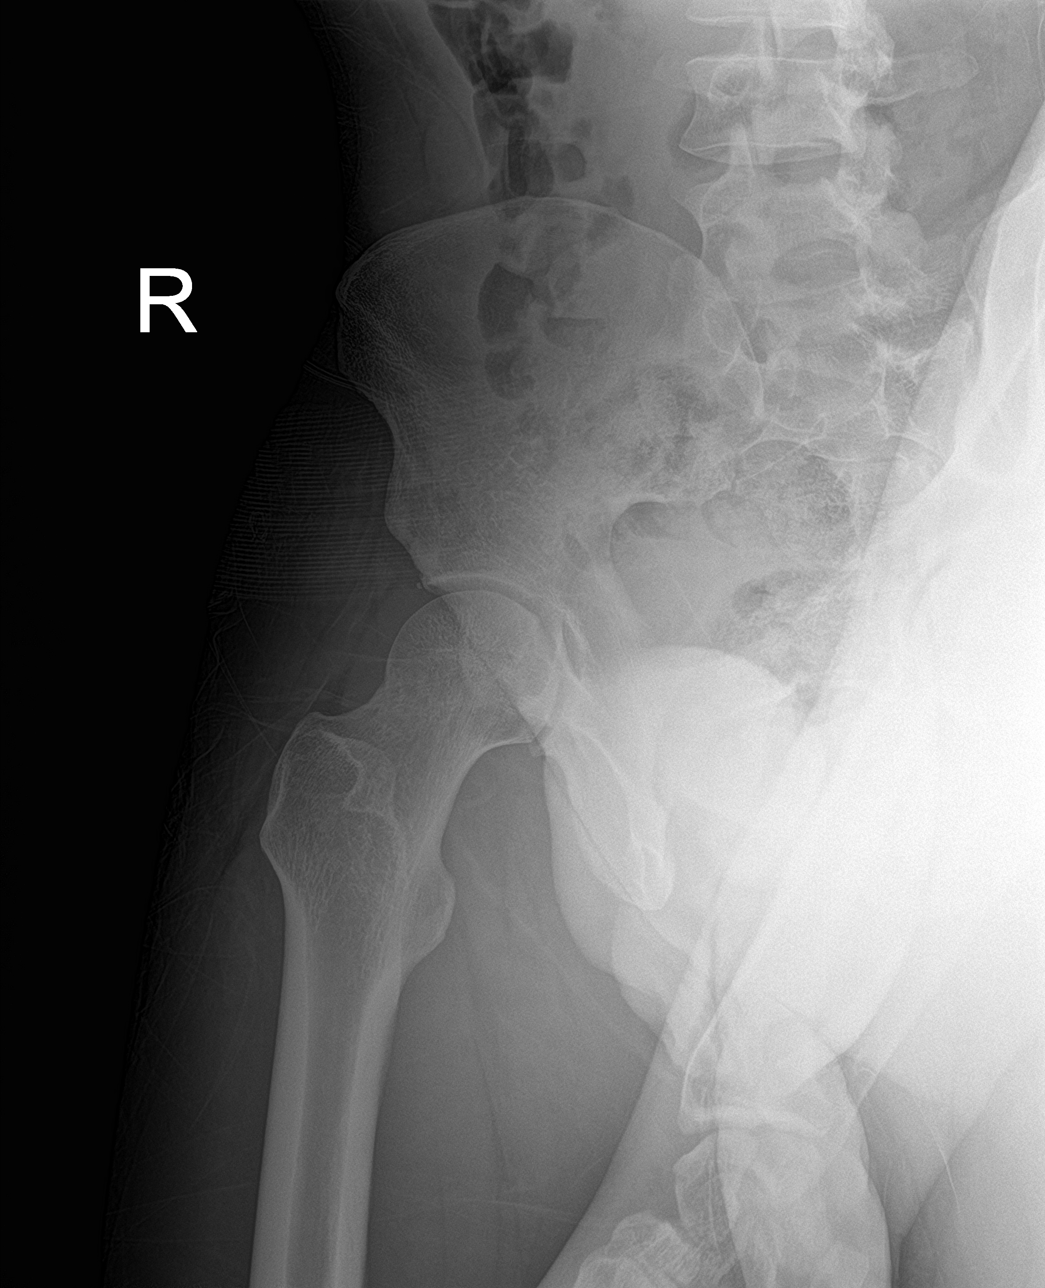

[3 of 3 positions shown; findings below may reference images not displayed]

FINDINGS: Frontal view of the pelvis as well as frontal and frogleg lateral
views of the right hip are obtained. Evaluation is slightly limited
due to difficulty with patient positioning. There are no acute
displaced fractures. Alignment of the bilateral hips is anatomic.
Joint spaces appear well preserved. Remainder of the bony pelvis is
unremarkable. Sacroiliac joints are normal.
IMPRESSION: 1. No acute bony abnormalities.
